# Patient Record
Sex: Female | Born: 1980 | Race: White | Hispanic: Yes | Marital: Single | Smoking: Never smoker
Health system: Southern US, Community
[De-identification: ages and names within clinical notes are randomized; demographics above are authoritative.]

## PROBLEM LIST (undated history)

## (undated) DIAGNOSIS — R87619 Unspecified abnormal cytological findings in specimens from cervix uteri: Secondary | ICD-10-CM

## (undated) DIAGNOSIS — IMO0002 Reserved for concepts with insufficient information to code with codable children: Secondary | ICD-10-CM

## (undated) HISTORY — DX: Unspecified abnormal cytological findings in specimens from cervix uteri: R87.619

## (undated) HISTORY — PX: NO PAST SURGERIES: SHX2092

## (undated) HISTORY — DX: Reserved for concepts with insufficient information to code with codable children: IMO0002

---

## 2004-11-09 ENCOUNTER — Ambulatory Visit (HOSPITAL_COMMUNITY): Admission: RE | Admit: 2004-11-09 | Discharge: 2004-11-09 | Payer: Self-pay | Admitting: *Deleted

## 2005-01-23 ENCOUNTER — Ambulatory Visit: Payer: Self-pay | Admitting: Family Medicine

## 2005-01-23 ENCOUNTER — Inpatient Hospital Stay (HOSPITAL_COMMUNITY): Admission: RE | Admit: 2005-01-23 | Discharge: 2005-01-23 | Payer: Self-pay | Admitting: *Deleted

## 2005-02-07 ENCOUNTER — Ambulatory Visit: Payer: Self-pay | Admitting: *Deleted

## 2005-02-07 ENCOUNTER — Inpatient Hospital Stay (HOSPITAL_COMMUNITY): Admission: AD | Admit: 2005-02-07 | Discharge: 2005-02-10 | Payer: Self-pay | Admitting: *Deleted

## 2009-02-16 ENCOUNTER — Ambulatory Visit (HOSPITAL_COMMUNITY): Admission: RE | Admit: 2009-02-16 | Discharge: 2009-02-16 | Payer: Self-pay | Admitting: Obstetrics & Gynecology

## 2009-06-02 ENCOUNTER — Ambulatory Visit: Payer: Self-pay | Admitting: Obstetrics & Gynecology

## 2009-06-04 ENCOUNTER — Ambulatory Visit: Payer: Self-pay | Admitting: Advanced Practice Midwife

## 2009-06-04 ENCOUNTER — Inpatient Hospital Stay (HOSPITAL_COMMUNITY): Admission: AD | Admit: 2009-06-04 | Discharge: 2009-06-06 | Payer: Self-pay | Admitting: Obstetrics & Gynecology

## 2010-05-24 LAB — CBC
HCT: 35.8 % — ABNORMAL LOW (ref 36.0–46.0)
Hemoglobin: 12.1 g/dL (ref 12.0–15.0)
MCHC: 33.7 g/dL (ref 30.0–36.0)
MCV: 88.7 fL (ref 78.0–100.0)
Platelets: 190 10*3/uL (ref 150–400)
RBC: 4.03 MIL/uL (ref 3.87–5.11)
RDW: 15.8 % — ABNORMAL HIGH (ref 11.5–15.5)
WBC: 15.2 10*3/uL — ABNORMAL HIGH (ref 4.0–10.5)

## 2010-05-24 LAB — RPR: RPR Ser Ql: NONREACTIVE

## 2010-12-05 ENCOUNTER — Inpatient Hospital Stay (HOSPITAL_COMMUNITY)
Admission: AD | Admit: 2010-12-05 | Discharge: 2010-12-05 | Disposition: A | Discharge status: Home / Self Care | Payer: Self-pay | Source: Ambulatory Visit | Attending: Obstetrics & Gynecology | Admitting: Obstetrics & Gynecology

## 2010-12-05 ENCOUNTER — Encounter (HOSPITAL_COMMUNITY): Payer: Self-pay

## 2010-12-05 ENCOUNTER — Inpatient Hospital Stay (HOSPITAL_COMMUNITY): Payer: Self-pay

## 2010-12-05 DIAGNOSIS — N760 Acute vaginitis: Secondary | ICD-10-CM

## 2010-12-05 DIAGNOSIS — O2390 Unspecified genitourinary tract infection in pregnancy, unspecified trimester: Secondary | ICD-10-CM | POA: Insufficient documentation

## 2010-12-05 DIAGNOSIS — B9689 Other specified bacterial agents as the cause of diseases classified elsewhere: Secondary | ICD-10-CM

## 2010-12-05 DIAGNOSIS — O209 Hemorrhage in early pregnancy, unspecified: Secondary | ICD-10-CM

## 2010-12-05 DIAGNOSIS — O219 Vomiting of pregnancy, unspecified: Secondary | ICD-10-CM

## 2010-12-05 DIAGNOSIS — A499 Bacterial infection, unspecified: Secondary | ICD-10-CM | POA: Insufficient documentation

## 2010-12-05 LAB — CBC
HCT: 34.3 % — ABNORMAL LOW (ref 36.0–46.0)
Hemoglobin: 11.6 g/dL — ABNORMAL LOW (ref 12.0–15.0)
MCH: 30.1 pg (ref 26.0–34.0)
MCHC: 33.8 g/dL (ref 30.0–36.0)
MCV: 88.9 fL (ref 78.0–100.0)
Platelets: 356 10*3/uL (ref 150–400)
RBC: 3.86 MIL/uL — ABNORMAL LOW (ref 3.87–5.11)
RDW: 13 % (ref 11.5–15.5)
WBC: 11.5 10*3/uL — ABNORMAL HIGH (ref 4.0–10.5)

## 2010-12-05 LAB — URINALYSIS, ROUTINE W REFLEX MICROSCOPIC
Bilirubin Urine: NEGATIVE
Hgb urine dipstick: NEGATIVE
Ketones, ur: NEGATIVE mg/dL
Leukocytes, UA: NEGATIVE
Nitrite: NEGATIVE
Protein, ur: NEGATIVE mg/dL
Urobilinogen, UA: 0.2 mg/dL (ref 0.0–1.0)

## 2010-12-05 LAB — HCG, QUANTITATIVE, PREGNANCY: hCG, Beta Chain, Quant, S: 92366 m[IU]/mL — ABNORMAL HIGH (ref <5)

## 2010-12-05 LAB — POCT PREGNANCY, URINE: Preg Test, Ur: POSITIVE

## 2010-12-05 LAB — WET PREP, GENITAL: Yeast Wet Prep HPF POC: NONE SEEN

## 2010-12-05 MED ORDER — PRENATAL RX 60-1 MG PO TABS: 1.0000 | ORAL_TABLET | Freq: Every day | ORAL | Status: DC

## 2010-12-05 MED ORDER — ONDANSETRON 8 MG PO TBDP: 8.0000 mg | ORAL_TABLET | Freq: Once | ORAL | Status: AC

## 2010-12-05 MED ORDER — PROMETHAZINE HCL 25 MG PO TABS: 25.0000 mg | ORAL_TABLET | Freq: Four times a day (QID) | ORAL | Status: AC | PRN

## 2010-12-05 MED ORDER — METRONIDAZOLE 500 MG PO TABS: 500.0000 mg | ORAL_TABLET | Freq: Two times a day (BID) | ORAL | Status: AC

## 2010-12-05 MED ORDER — ONDANSETRON 8 MG PO TBDP
8.0000 mg | ORAL_TABLET | Freq: Once | ORAL | Status: AC
  Administered 2010-12-05: 8 mg via ORAL
  Filled 2010-12-05: qty 1

## 2010-12-05 NOTE — ED Provider Notes (Signed)
History     Chief Complaint  Patient presents with  . Vaginal Bleeding   The history is provided by the patient. The history is limited by a language barrier. A language interpreter was used.   Pt presents with positive pregnancy test and spotting on and off with lower abdominal cramping since 10/1.  Pt also states that she has had a vaginal discharge.  Pt has also had some nausea and vomiting. Pt called the health Dept to be seen and since she was having spotting and pain, they advised her to come to [facility].  Past Medical History  Diagnosis Date  . No pertinent past medical history     Past Surgical History  Procedure Date  . No past surgeries     No family history on file.  History  Substance Use Topics  . Smoking status: Never Smoker   . Smokeless tobacco: Not on file  . Alcohol Use: No    Allergies: No Known Allergies  No prescriptions prior to admission    Review of Systems  Gastrointestinal: Positive for nausea, vomiting and abdominal pain. Negative for diarrhea and constipation.  Genitourinary: Negative for dysuria.   Physical Exam   Blood pressure 103/56, pulse 69, temperature 98.8 F (37.1 C), temperature source Oral, resp. rate 16, height 4\' 9"  (1.448 m), weight 108 lb 12.8 oz (49.351 kg), last menstrual period 10/11/2010, SpO2 100.00%.  Physical Exam  Vitals reviewed. Constitutional: She appears well-developed and well-nourished. No distress.  Eyes: Pupils are equal, round, and reactive to light.  Neck: Normal range of motion.  Cardiovascular: Normal rate.   GI: Soft. She exhibits no distension. Mass: wet prep. There is tenderness. There is no rebound and no guarding.       Pt is pointing to left mid quadrant tenderness- no rebound  Genitourinary:       Mod amount of frothy yellow discharge in vault; cervix clean NT uterus 8 week size nontender- adnexal without palpable enlargement or tenderness  Musculoskeletal: Normal range of motion.    Neurological: She is alert.  Skin: Skin is warm and dry.  Psychiatric: She has a normal mood and affect.    MAU Course  Procedures Ultrasound: Clinical Data: Abdominal pain, vaginal bleeding  OBSTETRIC <14 WK [place] AND TRANSVAGINAL OB US  Technique: Both transabdominal and transvaginal ultrasound  examinations were performed for complete evaluation of the  gestation as well as the maternal uterus, adnexal regions, and  pelvic cul-de-sac. Transvaginal technique was performed to assess  early pregnancy.  Comparison: None.  Intrauterine gestational sac: Visualized/normal in shape.  Yolk sac: Visualized  Embryo: Visualized  Cardiac Activity: Visualized  Heart Rate: 158 bpm  CRL: 17.9 mm 8 w 2 d [place] EDC: 07/15/2011  Maternal uterus/adnexae:  The uterus is otherwise normal. No subchorionic  hemorrhage/hematoma. No free fluid within the pelvis.  The right ovary is normal in size measuring 2.6 x 1.6 x 1.7 cm.  The left ovary is normal in size, measuring 2.2 x 1.9 x 1.6 cm. No  discrete adnexal lesions.  IMPRESSION:  1. Single viable intrauterine pregnancy with crown-rump length  correlating with gestational age of [redacted] weeks, 2 days, compatible  with delivery date of 07/15/2011. A detailed anatomy scan is  recommended at 18-21 weeks.  2. Bilateral ovaries appear normal.  Original Report Authenticated By: Waynard Reeds, M.D.      Imaging   Wet prep-mod clue cells GC/Chlamydia- pending CBC HCG ABO-Rh- O Pos Urinalysis-neg Zofran 8  mg ODT given for nausea in MAU    Assessment and Plan  Single living IUP [redacted]w[redacted]d- may proceed with OB care- confirmation of pregnancy Bleeding in early pregnancy Bacterial vaginosis-Flagyl 500 mg BID x 7 days Nausea and vomiting in pregnancy- will give Rx for Zofran and Phenergan  Ameen Mostafa 12/05/2010, 7:28 PM

## 2010-12-05 NOTE — Progress Notes (Signed)
Hospital translator in for triage. Pt states she started having spotting 10-1 and has had some cramping off and on. Had a POS UPT in September at home.

## 2010-12-06 LAB — GC/CHLAMYDIA PROBE AMP, GENITAL
Chlamydia, DNA Probe: NEGATIVE
GC Probe Amp, Genital: NEGATIVE

## 2011-05-14 ENCOUNTER — Other Ambulatory Visit (HOSPITAL_COMMUNITY): Payer: Self-pay | Admitting: Physician Assistant

## 2011-05-14 DIAGNOSIS — Z3689 Encounter for other specified antenatal screening: Secondary | ICD-10-CM

## 2011-05-14 LAB — RPR
RPR: NONREACTIVE
RPR: NONREACTIVE

## 2011-05-14 LAB — VARICELLA ZOSTER ANTIBODY, IGG: Varicella: IMMUNE

## 2011-05-14 LAB — HIV ANTIBODY (ROUTINE TESTING W REFLEX): HIV: NONREACTIVE

## 2011-05-14 LAB — GC/CHLAMYDIA PROBE AMP, GENITAL
Chlamydia: NEGATIVE
Gonorrhea: NEGATIVE

## 2011-05-14 LAB — ABO/RH

## 2011-05-14 LAB — CBC
HCT: 34 % — AB (ref 36–46)
Platelets: 268 10*3/uL (ref 150–399)

## 2011-05-15 ENCOUNTER — Ambulatory Visit (HOSPITAL_COMMUNITY)
Admission: RE | Admit: 2011-05-15 | Discharge: 2011-05-15 | Disposition: A | Discharge status: Home / Self Care | Payer: Medicaid Other | Source: Ambulatory Visit | Attending: Physician Assistant | Admitting: Physician Assistant

## 2011-05-15 DIAGNOSIS — Z363 Encounter for antenatal screening for malformations: Secondary | ICD-10-CM | POA: Insufficient documentation

## 2011-05-15 DIAGNOSIS — Z3689 Encounter for other specified antenatal screening: Secondary | ICD-10-CM

## 2011-05-15 DIAGNOSIS — O358XX0 Maternal care for other (suspected) fetal abnormality and damage, not applicable or unspecified: Secondary | ICD-10-CM | POA: Insufficient documentation

## 2011-05-15 DIAGNOSIS — Z1389 Encounter for screening for other disorder: Secondary | ICD-10-CM | POA: Insufficient documentation

## 2011-05-17 ENCOUNTER — Other Ambulatory Visit (HOSPITAL_COMMUNITY): Payer: Self-pay | Admitting: Physician Assistant

## 2011-05-17 DIAGNOSIS — Z3689 Encounter for other specified antenatal screening: Secondary | ICD-10-CM

## 2011-05-21 ENCOUNTER — Encounter: Payer: Self-pay | Admitting: Family Medicine

## 2011-05-21 ENCOUNTER — Ambulatory Visit (INDEPENDENT_AMBULATORY_CARE_PROVIDER_SITE_OTHER): Payer: Self-pay | Admitting: Obstetrics and Gynecology

## 2011-05-21 VITALS — BP 95/58 | Temp 97.1°F | Wt 125.1 lb

## 2011-05-21 DIAGNOSIS — N390 Urinary tract infection, site not specified: Secondary | ICD-10-CM

## 2011-05-21 DIAGNOSIS — O0993 Supervision of high risk pregnancy, unspecified, third trimester: Secondary | ICD-10-CM | POA: Insufficient documentation

## 2011-05-21 DIAGNOSIS — O409XX0 Polyhydramnios, unspecified trimester, not applicable or unspecified: Secondary | ICD-10-CM

## 2011-05-21 DIAGNOSIS — O4400 Placenta previa specified as without hemorrhage, unspecified trimester: Secondary | ICD-10-CM

## 2011-05-21 DIAGNOSIS — IMO0002 Reserved for concepts with insufficient information to code with codable children: Secondary | ICD-10-CM | POA: Insufficient documentation

## 2011-05-21 DIAGNOSIS — R87613 High grade squamous intraepithelial lesion on cytologic smear of cervix (HGSIL): Secondary | ICD-10-CM | POA: Insufficient documentation

## 2011-05-21 DIAGNOSIS — O403XX0 Polyhydramnios, third trimester, not applicable or unspecified: Secondary | ICD-10-CM

## 2011-05-21 DIAGNOSIS — O2390 Unspecified genitourinary tract infection in pregnancy, unspecified trimester: Secondary | ICD-10-CM

## 2011-05-21 DIAGNOSIS — O2340 Unspecified infection of urinary tract in pregnancy, unspecified trimester: Secondary | ICD-10-CM | POA: Insufficient documentation

## 2011-05-21 DIAGNOSIS — B951 Streptococcus, group B, as the cause of diseases classified elsewhere: Secondary | ICD-10-CM

## 2011-05-21 DIAGNOSIS — O4410 Placenta previa with hemorrhage, unspecified trimester: Secondary | ICD-10-CM

## 2011-05-21 LAB — POCT URINALYSIS DIP (DEVICE)
Glucose, UA: NEGATIVE mg/dL
Hgb urine dipstick: NEGATIVE
Ketones, ur: NEGATIVE mg/dL
Leukocytes, UA: NEGATIVE
Nitrite: NEGATIVE
Protein, ur: NEGATIVE mg/dL
Specific Gravity, Urine: 1.01 (ref 1.005–1.030)
Urobilinogen, UA: 0.2 mg/dL (ref 0.0–1.0)

## 2011-05-21 LAB — FETAL NONSTRESS TEST

## 2011-05-21 NOTE — Progress Notes (Signed)
Patient doing well without complaints. 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

## 2011-05-21 NOTE — Patient Instructions (Addendum)
Placenta previa (Placenta Previa) La placenta previa es un trastorno en el que la placenta crece en la zona inferior de la International aid/development worker (tero) El rgano que conecta al feto con el tero de la madre (la placenta) se implanta en la porcin inferior del cuello del tero (crvix) Puede obstruirlo parcial o completamente. La causa es desconocida. Es ms frecuente en el caso de los nacimientos mltiples o de mellizos. SNTOMAS El signo o sntoma principal es la hemorragia vaginal. La hemorragia puede ser leve o muy abundante y muy grave para la madre y el beb. Generalmente este problema no presenta sntomas. En algunos casos, si la ubicacin de la placenta es muy baja, se desprender parcialmente y Sport and exercise psychologist. Puede tratarse simplemente de la separacin de un seno marginal de la placenta. Es una separacin de los vasos sanguneos de la pared del tero. No suele ocasionar problemas ms all de una leve ansiedad. Hay un riesgo elevado de restriccin del desarrollo intrauterino debido a la ubicacin anormal de la placenta. DIAGNSTICO El diagnstico se lleva a cabo con una ecografa del tero. Es necesario un cuidadoso examen vaginal para observar el cuello del tero. La paciente ser preparada para una intervencin cesrea inmediata, en caso de ser necesario. TRATAMIENTO El tratamiento consiste en reposo en cama, en el hospital o en el hogar. Le administrarn medicamentos para detener las contracciones. Las contracciones pueden aumentar la hemorragia. El mdico extraer lquido del saco amnitico (amniocentesis) para verificar si los pulmones del beb estn lo suficientemente maduros como para soportar la intervencin cesrea. Si hay poca cantidad de glbulos rojos, ser necesaria una transfusin de [place]. Si hay una placenta previa en grado leve, no es necesario ningn tratamiento. La placenta previa en las primeras etapas suele resolverse sin ayuda. La placenta se mueve hacia un lugar ms alto en el  canal de parto, a medida que el [employer]. En este caso, la placenta ya no constituye una obstruccin para el parto. Puede ser necesario controlar la posicin de la placenta durante el [place]. Esto se lleva a cabo con una ecografa en el vientre (abdomen). Consulte inmediatamente con el profesional si la hemorragia es muy intensa. Podr ser necesario reponer lquidos o sangre inmediatamente. En caso de placenta previa completa, el nico modo de parto seguro es la operacin cesrea. INSTRUCCIONES PARA EL CUIDADO DOMICILIARIO  Siga las indicaciones del profesional con respecto al reposo en cama.   Tome las pldoras de hierro y todos los medicamentos que le prescriban.   No se incline ni levante objetos pesados.   No mantenga relaciones sexuales.   No introduzca nada en la vagina (tampones o cremas vaginales). Si tiene hemorragia, use toallas higinicas.   Si el profesional que lo [employer] pide que concurra a una cita de seguimiento, es importante asistir a ella. No concurrir a los IT consultant como consecuencia un dao crnico o [place], [place], discapacidad y [place] la muerte suya o del feto. Si tiene algn problema para asistir a la cita, debe comunicarse con el establecimiento para obtener asistencia.  SOLICITE ATENCIN MDICA DE INMEDIATO SI :  Observa un aumento de la hemorragia.   Si se siente mareada o se desmaya.   Presenta dolor abdominal.   Ya no puede sentir los movimientos normales del beb.   Siente contracciones uterinas.  Document Released: 11/29/2004 Document Revised: 02/08/2011 [facility] Patient Information 2012 [place], [place].  Papanicolau (Pap Test) El papanicolau consiste en la toma de una muestra de clulas del cuello del tero  de [employer]. El cuello uterino es la abertura que comunica el tero con la vagina (canal de [place]). Las clulas se obtienen durante el examen plvico. Las clulas se observan al microscopio para ver si son normales o se  estn desarrollando clulas cancerosas, o existen modificaciones que indican que se Art gallery manager. La displasia cervical es un trastorno en el que una mujer tiene modificaciones anormales en las clulas del cuello uterino. Estos cambios son un signo precoz de que puede Art gallery manager cervical. Tambin puede detectarse el virus del Geneticist, molecular (VPH) debido a que 4 tipos son responsables en el 70% de los casos de cncer cervical. Tambin puede detectar infecciones producidas por bacterias, hongos, protozoos y virus.  El cncer cervical es difcil de tratar y es menos probable que tenga un buen desenlace si no se trata. Detectar la enfermedad en sus estadios ms tempranos conduce a mejores resultados. Desde que se ha comenzado a Forensic scientist, hace 60 aos, las muertes por cncer cervical disminuyeron en un 70%. Todas las mujeres deben hacer un examen Papanicolau. LOS FACTORES DE RIESGO PARA EL CNCER CERVICAL SON:   Ser sexualmente activa antes de los 18 aos.   Ser hija de una mujer que tom dietilstilbestrol (DES) durante el embarazo.   Tener un compaero sexual que ha sufrido cncer de pene.   Tener un compaero sexual cuya pareja haya sufrido cncer cervical o displasia cervical (las primeras modificaciones que indican que se puede Civil Service fast streamer.   Debilidad en el sistema inmunolgico. Por ejemplo sufrir VIH u otros problemas de inmunodeficiencia.   Haber sufrido infecciones de transmisin sexual, como clamidia, gonorrea o VPH.   Tener un Papanicolau anormal o cncer en la vagina o la vulva.   Tener ms de un compaero sexual.   Scheryl Darter de cncer cervical en la hermana o la madre.   No usar condones con un nuevo compaero sexual.   El consumo de cigarrillos.  Fleet Contras UN PAPANICOLAU  Un Papanicolau se realiza para Engineer, site cervical.   El primer Papanicolaou debe realizarse los 21 aos.   Dynegy 21 y los 29 aos debe  repetirse [address]   Luego de los 30 aos, debe realizarse un Papanicolaou cada tres aos siempre que los 3 estudios anteriores sean normales.   Algunas mujeres sufren problemas mdicos que aumentan la probabilidad de Research officer, political party cervical. Consulte a su mdico acerca de estos problemas. Es muy importante que le informe a su mdico si aparecen nuevos problemas poco despus de su ltimo Papanicolaou. En estos casos, el mdico podr indicar que se realice el Papanicolaou con ms frecuencia.   Estas recomendaciones son las mismas para todas las mujeres hayan recibido o no la vacuna para el VPH (virus del papiloma humano).   Si le han realizado una histerectoma por un problema que no era cncer u otra enfermedad que podra causar cncer, ya no necesitar un Papanicolaou. Sin embargo, aunque ya no necesite un Papanicolau, es una buena idea Medical sales representative examen regular para asegurarse de que no hay otros problemas.    Si tiene entre 65 y 56 aos y ha tenido un Papanicolaou normal en los ltimos 10 aos, ya no ser Music therapist. Sin embargo, aunque ya no necesite un Papanicolau, es una buena idea Medical sales representative examen regular para asegurarse de que no hay otros problemas.    Si ha recibido un tratamiento para Management consultant cervical o para una enfermedad que podra causar cncer,  necesitar realizar un Papanicolaou y controles durante al menos 20 aos de concluir el tratamiento   Si no ha tenido continuidad para [employer] Papanicolau, debern evaluarse nuevamente los factores de riesgo (como tener una nueva pareja sexual) para Chief Strategy Officer si deben [employer].   Algunas mujeres necesitarn realizarse estudios con ms frecuencia si tienen factores de riesgo para Engineer, structural.  PREPARACIN PARA EL PAPANICOLAU Debe realizarse durante las semanas anteriores a la [place]. No deben hacerse duchas vaginales ni tener [employer] 24 horas anteriores a la prueba. No  colocarse cremas vaginales, diafragmas o tampones durante las 24 horas anteriores a la prueba. Para minimizar las molestias, podr vaciar la vejiga antes del examen. TOMA DE LA MUESTRA El profesional le har un examen plvico. Colocar un instrumento de metal o plstico (espculo) en la vagina. Esto se hace antes de que el mdico realice un examen bimanual de los rganos femeninos internos. Este instrumento le permite al mdico observar el interior de la vagina y Public affairs consultant el cuello uterino. Para tomar la [place] de las clulas de la abertura interna del cuello uterino, se utiliza un pequeo cepillo estril. Para raspar la zona externa del cuello uterino se utiliza una esptula de [place]. [place] de [employer]. Las [employer] se colocan en un portaobjetos de vidrio o en una pequea botella llena con un lquido especial. Luego las clulas se observarn en el microscopio de un laboratorio. Un especialista observar las clulas y determinar si son normales. RESULTADOS DEL PAPANICOLAU  Un Papanicolau normal no muestra clulas anormales ni evidencias de inflamacin.   La presencia de clulas que se desarrollan de manera anormal en la superficie del cuello uterino ser informada como un Papanicolau anormal. Se utilizan diferentes categoras de Librarian, academic. El profesional que la asiste comentar con usted la importancia de Proofreader. El Nurse, children's cuales son los controles que deber seguir o cuando deber Producer, television/film/video.   Si tiene dos  ms Pap anormales:   Le solicitarn que se haga una colposcopa. En esta prueba se observar el cervix con un microscpico luminoso especial.   Tambin ser necesaria la toma de [place] de tejido (biopsia). Se realiza tomando una pequea [place] de tejido. La muestra es observada en el microscopio para hallar la causa de las clulas anormales. Asegrese de [employer] de su Papanicolau. Si no recibe los  [address] de [place], comunquese con el consultorio de su mdico. Recuerde que es su responsabilidad obtener los [place] y Financial risk analyst cuales son los controles que debe seguir.  Document Released: 08/08/2007 Document Revised: 02/08/2011 [facility] Patient Information 2012 [place], [place].

## 2011-05-21 NOTE — Progress Notes (Signed)
Pelvic pressure. No vaginal discharge. +GBS in urine. 1 Hr GTT 53. Pap HSIL. Complete placenta previa and polyhydramnios. Pt to see nutrition and Child psychotherapist. Interpreter was present.

## 2011-05-24 ENCOUNTER — Encounter: Payer: Self-pay | Admitting: Obstetrics and Gynecology

## 2011-05-25 ENCOUNTER — Other Ambulatory Visit: Payer: Self-pay

## 2011-05-28 ENCOUNTER — Ambulatory Visit (INDEPENDENT_AMBULATORY_CARE_PROVIDER_SITE_OTHER): Payer: Self-pay | Admitting: Obstetrics and Gynecology

## 2011-05-28 VITALS — BP 102/50 | Temp 97.1°F | Ht <= 58 in | Wt 125.2 lb

## 2011-05-28 DIAGNOSIS — B951 Streptococcus, group B, as the cause of diseases classified elsewhere: Secondary | ICD-10-CM

## 2011-05-28 DIAGNOSIS — IMO0002 Reserved for concepts with insufficient information to code with codable children: Secondary | ICD-10-CM

## 2011-05-28 DIAGNOSIS — N390 Urinary tract infection, site not specified: Secondary | ICD-10-CM

## 2011-05-28 DIAGNOSIS — O0990 Supervision of high risk pregnancy, unspecified, unspecified trimester: Secondary | ICD-10-CM

## 2011-05-28 DIAGNOSIS — O409XX0 Polyhydramnios, unspecified trimester, not applicable or unspecified: Secondary | ICD-10-CM

## 2011-05-28 DIAGNOSIS — O2390 Unspecified genitourinary tract infection in pregnancy, unspecified trimester: Secondary | ICD-10-CM

## 2011-05-28 DIAGNOSIS — O4400 Placenta previa specified as without hemorrhage, unspecified trimester: Secondary | ICD-10-CM

## 2011-05-28 DIAGNOSIS — O403XX0 Polyhydramnios, third trimester, not applicable or unspecified: Secondary | ICD-10-CM

## 2011-05-28 DIAGNOSIS — R87613 High grade squamous intraepithelial lesion on cytologic smear of cervix (HGSIL): Secondary | ICD-10-CM

## 2011-05-28 DIAGNOSIS — O0993 Supervision of high risk pregnancy, unspecified, third trimester: Secondary | ICD-10-CM

## 2011-05-28 LAB — POCT URINALYSIS DIP (DEVICE)
Bilirubin Urine: NEGATIVE
Hgb urine dipstick: NEGATIVE
Ketones, ur: NEGATIVE mg/dL
Nitrite: NEGATIVE
pH: 6.5 (ref 5.0–8.0)

## 2011-05-28 LAB — FETAL NONSTRESS TEST

## 2011-05-28 NOTE — Progress Notes (Signed)
3/25 NST reviewed and category I. Patient doing well without complaints. Denies any vaginal bleeding. Patient presenting today for colposcopy. Patient with h/o LGSIL in 10/2010 but never had colposcopy. Repeat pap smear on 05/14/2011 HGSIL Patient given informed consent, signed copy in the chart, time out was performed.  Placed in lithotomy position. Cervix viewed with speculum and colposcope after application of acetic acid.   Colposcopy adequate?  yes Acetowhite lesions?9 o'clock position Punctation?no  Mosaicism?  no Abnormal vasculature?  no Biopsies?no ECC?no  COMMENTS: Patient was given post procedure instructions.  She will return as planned for ob appointments. Patient understands that further management for her abnormal pap smear will be addressed 6 weeks postpartum.

## 2011-05-28 NOTE — Progress Notes (Incomplete)
Nutrition Note:  (1st consult) Pt dx with placenta previa and polyhydraminos.   Current wt gain of 16.2# @ [redacted]w[redacted]d plots around 2#< expected (pg BMI- normal weight).   Pt also has low Fe levels (9.4ug/dL /78%) at 2/95/62 visit. Pt reports good intake of 3 meals and snacks, typically fruits.  No food allergies and no N/V reported.   Does take PNV daily, no Fe supplement reported. Pt receives [facility] services and plans to breastfeed. Pt agrees to increase iron rich foods and add 1 protein rich snack into daily intake.  Follow up if referred.  Cy Blamer, RD

## 2011-05-31 ENCOUNTER — Other Ambulatory Visit: Payer: Self-pay

## 2011-06-04 ENCOUNTER — Ambulatory Visit (INDEPENDENT_AMBULATORY_CARE_PROVIDER_SITE_OTHER): Payer: Self-pay | Admitting: Obstetrics & Gynecology

## 2011-06-04 VITALS — BP 97/53 | Temp 97.7°F | Wt 125.8 lb

## 2011-06-04 DIAGNOSIS — O4400 Placenta previa specified as without hemorrhage, unspecified trimester: Secondary | ICD-10-CM

## 2011-06-04 DIAGNOSIS — O0993 Supervision of high risk pregnancy, unspecified, third trimester: Secondary | ICD-10-CM

## 2011-06-04 DIAGNOSIS — O409XX0 Polyhydramnios, unspecified trimester, not applicable or unspecified: Secondary | ICD-10-CM

## 2011-06-04 DIAGNOSIS — O403XX0 Polyhydramnios, third trimester, not applicable or unspecified: Secondary | ICD-10-CM

## 2011-06-04 LAB — POCT URINALYSIS DIP (DEVICE)
Hgb urine dipstick: NEGATIVE
Ketones, ur: NEGATIVE mg/dL
Protein, ur: NEGATIVE mg/dL
Specific Gravity, Urine: 1.015 (ref 1.005–1.030)
pH: 5 (ref 5.0–8.0)

## 2011-06-04 LAB — FETAL NONSTRESS TEST

## 2011-06-04 NOTE — Progress Notes (Signed)
No complaints, [place] 4/2 to check AFI and placentation. NST today reactive

## 2011-06-04 NOTE — Patient Instructions (Signed)
Placenta previa (Placenta Previa) La placenta previa es un trastorno en el que la placenta crece en la zona inferior de la matriz (tero) El rgano que conecta al feto con el tero de la madre (la placenta) se implanta en la porcin inferior del cuello del tero (crvix) Puede obstruirlo parcial o completamente. La causa es desconocida. Es ms frecuente en el caso de los nacimientos mltiples o de mellizos. SNTOMAS El signo o sntoma principal es la hemorragia vaginal. La hemorragia puede ser leve o muy abundante y muy grave para la madre y el beb. Generalmente este problema no presenta sntomas. En algunos casos, si la ubicacin de la placenta es muy baja, se desprender parcialmente y ocasionar una hemorragia. Puede tratarse simplemente de la separacin de un seno marginal de la placenta. Es una separacin de los vasos sanguneos de la pared del tero. No suele ocasionar problemas ms all de una leve ansiedad. Hay un riesgo elevado de restriccin del desarrollo intrauterino debido a la ubicacin anormal de la placenta. DIAGNSTICO El diagnstico se lleva a cabo con una ecografa del tero. Es necesario un cuidadoso examen vaginal para observar el cuello del tero. La paciente ser preparada para una intervencin cesrea inmediata, en caso de ser necesario. TRATAMIENTO El tratamiento consiste en reposo en cama, en el hospital o en el hogar. Le administrarn medicamentos para detener las contracciones. Las contracciones pueden aumentar la hemorragia. El mdico extraer lquido del saco amnitico (amniocentesis) para verificar si los pulmones del beb estn lo suficientemente maduros como para soportar la intervencin cesrea. Si hay poca cantidad de glbulos rojos, ser necesaria una transfusin de sangre. Si hay una placenta previa en grado leve, no es necesario ningn tratamiento. La placenta previa en las primeras etapas suele resolverse sin ayuda. La placenta se mueve hacia un lugar ms alto en el  canal de parto, a medida que el embarazo avanza. En este caso, la placenta ya no constituye una obstruccin para el parto. Puede ser necesario controlar la posicin de la placenta durante el embarazo. Esto se lleva a cabo con una ecografa en el vientre (abdomen). Consulte inmediatamente con el profesional si la hemorragia es muy intensa. Podr ser necesario reponer lquidos o sangre inmediatamente. En caso de placenta previa completa, el nico modo de parto seguro es la operacin cesrea. INSTRUCCIONES PARA EL CUIDADO DOMICILIARIO  Siga las indicaciones del profesional con respecto al reposo en cama.   Tome las pldoras de hierro y todos los medicamentos que le prescriban.   No se incline ni levante objetos pesados.   No mantenga relaciones sexuales.   No introduzca nada en la vagina (tampones o cremas vaginales). Si tiene hemorragia, use toallas higinicas.   Si el profesional que lo asiste le pide que concurra a una cita de seguimiento, es importante asistir a ella. No concurrir a los controles puede tener como consecuencia un dao crnico o permanente, dolores, discapacidad y hasta la muerte suya o del feto. Si tiene algn problema para asistir a la cita, debe comunicarse con el establecimiento para obtener asistencia.  SOLICITE ATENCIN MDICA DE INMEDIATO SI :  Observa un aumento de la hemorragia.   Si se siente mareada o se desmaya.   Presenta dolor abdominal.   Ya no puede sentir los movimientos normales del beb.   Siente contracciones uterinas.  Document Released: 11/29/2004 Document Revised: 02/08/2011 [employer] Patient Information 2012 [employer]. 

## 2011-06-04 NOTE — Progress Notes (Signed)
P = 67   Pt has [place] @ [facility]FM  tomorrow

## 2011-06-05 ENCOUNTER — Ambulatory Visit (HOSPITAL_COMMUNITY): Payer: Self-pay

## 2011-06-08 ENCOUNTER — Other Ambulatory Visit: Payer: Self-pay

## 2011-06-11 ENCOUNTER — Ambulatory Visit (HOSPITAL_COMMUNITY)
Admission: RE | Admit: 2011-06-11 | Discharge: 2011-06-11 | Disposition: A | Discharge status: Home / Self Care | Payer: Medicaid Other | Source: Ambulatory Visit | Attending: Physician Assistant | Admitting: Physician Assistant

## 2011-06-11 ENCOUNTER — Other Ambulatory Visit: Payer: Self-pay

## 2011-06-11 ENCOUNTER — Ambulatory Visit (INDEPENDENT_AMBULATORY_CARE_PROVIDER_SITE_OTHER): Payer: Self-pay | Admitting: Obstetrics & Gynecology

## 2011-06-11 VITALS — BP 98/61 | HR 61 | Temp 97.1°F | Wt 126.3 lb

## 2011-06-11 DIAGNOSIS — O4410 Placenta previa with hemorrhage, unspecified trimester: Secondary | ICD-10-CM | POA: Insufficient documentation

## 2011-06-11 DIAGNOSIS — Z3689 Encounter for other specified antenatal screening: Secondary | ICD-10-CM | POA: Insufficient documentation

## 2011-06-11 DIAGNOSIS — O4400 Placenta previa specified as without hemorrhage, unspecified trimester: Secondary | ICD-10-CM

## 2011-06-11 DIAGNOSIS — O403XX0 Polyhydramnios, third trimester, not applicable or unspecified: Secondary | ICD-10-CM

## 2011-06-11 DIAGNOSIS — O409XX0 Polyhydramnios, unspecified trimester, not applicable or unspecified: Secondary | ICD-10-CM

## 2011-06-11 LAB — POCT URINALYSIS DIP (DEVICE)
Bilirubin Urine: NEGATIVE
Hgb urine dipstick: NEGATIVE
Ketones, ur: NEGATIVE mg/dL
pH: 7 (ref 5.0–8.0)

## 2011-06-11 LAB — US OB COMP + 14 WK

## 2011-06-11 NOTE — Progress Notes (Signed)
Growth [place] scheduled today @ 1015

## 2011-06-11 NOTE — Progress Notes (Signed)
NST today reactive. [place] was rescheduled for today

## 2011-06-11 NOTE — Patient Instructions (Signed)
Placenta previa (Placenta Previa) La placenta previa es un trastorno en el que la placenta crece en la zona inferior de la matriz (tero) El rgano que conecta al feto con el tero de la madre (la placenta) se implanta en la porcin inferior del cuello del tero (crvix) Puede obstruirlo parcial o completamente. La causa es desconocida. Es ms frecuente en el caso de los nacimientos mltiples o de mellizos. SNTOMAS El signo o sntoma principal es la hemorragia vaginal. La hemorragia puede ser leve o muy abundante y muy grave para la madre y el beb. Generalmente este problema no presenta sntomas. En algunos casos, si la ubicacin de la placenta es muy baja, se desprender parcialmente y ocasionar una hemorragia. Puede tratarse simplemente de la separacin de un seno marginal de la placenta. Es una separacin de los vasos sanguneos de la pared del tero. No suele ocasionar problemas ms all de una leve ansiedad. Hay un riesgo elevado de restriccin del desarrollo intrauterino debido a la ubicacin anormal de la placenta. DIAGNSTICO El diagnstico se lleva a cabo con una ecografa del tero. Es necesario un cuidadoso examen vaginal para observar el cuello del tero. La paciente ser preparada para una intervencin cesrea inmediata, en caso de ser necesario. TRATAMIENTO El tratamiento consiste en reposo en cama, en el hospital o en el hogar. Le administrarn medicamentos para detener las contracciones. Las contracciones pueden aumentar la hemorragia. El mdico extraer lquido del saco amnitico (amniocentesis) para verificar si los pulmones del beb estn lo suficientemente maduros como para soportar la intervencin cesrea. Si hay poca cantidad de glbulos rojos, ser necesaria una transfusin de sangre. Si hay una placenta previa en grado leve, no es necesario ningn tratamiento. La placenta previa en las primeras etapas suele resolverse sin ayuda. La placenta se mueve hacia un lugar ms alto en el  canal de parto, a medida que el embarazo avanza. En este caso, la placenta ya no constituye una obstruccin para el parto. Puede ser necesario controlar la posicin de la placenta durante el embarazo. Esto se lleva a cabo con una ecografa en el vientre (abdomen). Consulte inmediatamente con el profesional si la hemorragia es muy intensa. Podr ser necesario reponer lquidos o sangre inmediatamente. En caso de placenta previa completa, el nico modo de parto seguro es la operacin cesrea. INSTRUCCIONES PARA EL CUIDADO DOMICILIARIO  Siga las indicaciones del profesional con respecto al reposo en cama.   Tome las pldoras de hierro y todos los medicamentos que le prescriban.   No se incline ni levante objetos pesados.   No mantenga relaciones sexuales.   No introduzca nada en la vagina (tampones o cremas vaginales). Si tiene hemorragia, use toallas higinicas.   Si el profesional que lo asiste le pide que concurra a una cita de seguimiento, es importante asistir a ella. No concurrir a los controles puede tener como consecuencia un dao crnico o permanente, dolores, discapacidad y hasta la muerte suya o del feto. Si tiene algn problema para asistir a la cita, debe comunicarse con el establecimiento para obtener asistencia.  SOLICITE ATENCIN MDICA DE INMEDIATO SI :  Observa un aumento de la hemorragia.   Si se siente mareada o se desmaya.   Presenta dolor abdominal.   Ya no puede sentir los movimientos normales del beb.   Siente contracciones uterinas.  Document Released: 11/29/2004 Document Revised: 02/08/2011 [employer] Patient Information 2012 [employer]. 

## 2011-06-11 NOTE — Progress Notes (Signed)
Pulse 61. No pain. Pressure in pelvic. Vaginal d/c described as small watery, and cloudy that happened twice yesterday.

## 2011-06-12 ENCOUNTER — Other Ambulatory Visit (HOSPITAL_COMMUNITY): Payer: Self-pay | Admitting: Physician Assistant

## 2011-06-12 DIAGNOSIS — Z3689 Encounter for other specified antenatal screening: Secondary | ICD-10-CM

## 2011-06-15 ENCOUNTER — Encounter: Payer: Self-pay | Admitting: *Deleted

## 2011-06-18 ENCOUNTER — Ambulatory Visit (INDEPENDENT_AMBULATORY_CARE_PROVIDER_SITE_OTHER): Payer: Self-pay | Admitting: Obstetrics & Gynecology

## 2011-06-18 VITALS — BP 108/60 | Temp 97.7°F | Wt 127.0 lb

## 2011-06-18 DIAGNOSIS — O0993 Supervision of high risk pregnancy, unspecified, third trimester: Secondary | ICD-10-CM

## 2011-06-18 DIAGNOSIS — O4400 Placenta previa specified as without hemorrhage, unspecified trimester: Secondary | ICD-10-CM

## 2011-06-18 LAB — POCT URINALYSIS DIP (DEVICE)
Bilirubin Urine: NEGATIVE
Ketones, ur: NEGATIVE mg/dL
Protein, ur: NEGATIVE mg/dL
Specific Gravity, Urine: 1.015 (ref 1.005–1.030)

## 2011-06-18 NOTE — Progress Notes (Signed)
Addended by: Adam Phenix on: 06/18/2011 10:12 AM   Modules accepted: Orders

## 2011-06-18 NOTE — Progress Notes (Signed)
Pulse 62. No pain. Pressure on pelvic. Vaginal d/c described as mucous yellow; no odor, no itch.

## 2011-06-18 NOTE — Patient Instructions (Signed)
Parto por cesrea  (Cesarean Delivery) El parto por cesrea es el nacimiento de un feto a travs de un corte (incisin) en el vientre (abdomen) y en el matriz (tero).  HGALE SABER A SU MDICO SOBRE:   Complicaciones del embarazo.   Alergias.   Medicamentos que utiliza, incluyendo hierbas, gotas oftlmicas, medicamentos de venta libre y cremas.   Uso de corticoides (por va oral o cremas).   Problemas anteriores debido a anestsicos o a medicamentos que disminuyen la sensibilidad.   Cirugas anteriores.   Historia de cogulos sanguneos   Problemas hemorrgicos o en la sangre.   Otros problemas de salud.  RIESGOS Y COMPLICACIONES  Hemorragias.   Infeccin.   Cogulos sanguneos.   Lesin en los rganos circundantes.   Problemas con la anestesia.   Lesiones al beb.  ANTES DEL PROCEDIMIENTO   Le insertarn un tubo (catter Foley)en la vejiga. El catter Foley drena la orina desde la vejiga hacia una bolsa. Esto mantendr la vejiga vaca durante la ciruga.   Le colocarn un catter de acceso intravenoso (IV) en el brazo.   Luego rasurarn la zona del pubis y la parte inferior del abdomen. Esto se realiza para evitar una infeccin en el sitio de la incisin.   Le administrarn un medicamento anticido. Esto impedir que los contenidos cidos del estmago ingresen a los pulmones si vomita durante el procedimiento.   Le podrn dar antibiticos para prevenir infecciones.  PROCEDIMIENTO   Le administrarn un medicamento para adormecer a zona inferior del cuerpo anestesia regional). Si estuviera en trabajo de parto, podrn aplicarle una anestesia epidural, que se utiliza tanto el el trabajo de parto como en la cesrea. Puede ser que le administren un medicamento que la har dormir (anestesia general), aunque esto no es tan frecuente.   Le harn una incisin en el abdomen que se extiende hacia el tero. Hay dos tipos bsicos de incisin:   La incisin horizontal  (transversa) Las incisiones horizontales se realizan en la mayor parte de las cesreas de rutina.   La incisin vertical (de arriba hacia abajo). Esta es la menos frecuente. Se reserva para aquellas mujeres que tienen una complicacin grave (parto prematuro extremo) o bajo situaciones de emergencia.   Las incisiones horizontales y verticales pueden utilizarse ambas al mismo tiempo. Sin embargo, no es muy frecuente.   El beb nacer.  DESPUS DEL PROCEDIMIENTO   Si estuvo despierta durante la ciruga, podr ver al beb enseguida. Si la duermen, ver al beb tan pronto como despierte.   Podr amamantar a su beb despus del procedimiento.   Podr levantarse y caminar el mismo da de la ciruga. Si debe permanecer en cama durante cierto tiempo, recibir ayuda para darse vuelta, toser y respirar profundamente despus de la ciruga. Esto ayuda a evitar complicaciones en los pulmones, como la neumona.   No se levante de la cama sola la primera vez luego de la ciruga. Necesitar ayuda para levantarse de la cama hasta que pueda hacerlo sola.   Podr darse una ducha el da siguiente a la ciruga. Despus que le quiten el vendaje, un enfermero la ayudar a ducharse.   Tendr unas medias compresivas neumticas en los pies o en la zona inferior de las piernas. Se colocan para prevenir cogulos sanguneos. Cuando se levante y camine regularmente, ya no sern necesarias.   Nocruce las piernas al sentarse.   Si elimina cogulos de sangre, gurdelos. Si elimina un cogulo cuando va al bao, por favor no tire la   cadena. Llame al enfermero. Comunquele al enfermero si piensa que tiene demasiada hemorragia o que elimina muchos cogulos.   Comience a beber lquidos y a alimentarse segn las indicaciones del mdico. Si el estmago no est en condiciones,comer y beber demasiado pronto puede causar aumento de la hinchazn y la inflamacin del intestino o el abdomen. Esto es muy molesto.   Le darn  medicamentos si los necesita. Dgale a los profesionales si siente dolor. Ellos tratarn de que se sienta cmoda. Tambin le indicarn antibiticos para prevenir una infeccin.   Le quitarn la va intravenosa cuando beba una cantidad razonable de lquido. El catter Foley se retirar cuando se levante y camine.   Si su tipo sanguneo es Rh negativo y el beb es Rh positivo, le darn una inyeccin de inmunoblobulina anti D. Esta inyeccin evita que tenga problemas con el Rh en embarazos futuros. Deber colocarse la inyeccin an si se ha hecho ligar las trompas.   Si le permiten llevar al beb a dar un paseo,colquelo en la cunita y empjela. No lleve al beb en sus brazos.  Document Released: 02/19/2005 Document Revised: 02/08/2011 [employer] Patient Information 2012 [employer]. 

## 2011-06-18 NOTE — Progress Notes (Signed)
C/S scheduled June 22, 2011 at 9 am. Patient advised via interpreter of date and time and to be NPO after midnight. Patient advised [facility] will call her with pre-op appointment time and date.

## 2011-06-18 NOTE — Progress Notes (Signed)
Addended by: Adam Phenix on: 06/18/2011 10:22 AM   Modules accepted: Orders

## 2011-06-18 NOTE — Progress Notes (Signed)
Schedule  At 36 w 5 days on 06/22/11

## 2011-06-18 NOTE — Progress Notes (Signed)
[place] 4/9 showed complete previa, Explained to patient the need to schedule CS. GBS done. Send GC and CT from urine. Normal AFI

## 2011-06-19 LAB — GC/CHLAMYDIA PROBE AMP, URINE
Chlamydia, Swab/Urine, PCR: NEGATIVE
GC Probe Amp, Urine: NEGATIVE

## 2011-06-20 ENCOUNTER — Inpatient Hospital Stay (HOSPITAL_COMMUNITY): Payer: Medicaid Other | Admitting: Anesthesiology

## 2011-06-20 ENCOUNTER — Encounter (HOSPITAL_COMMUNITY): Payer: Self-pay | Admitting: Anesthesiology

## 2011-06-20 ENCOUNTER — Encounter (HOSPITAL_COMMUNITY): Payer: Self-pay | Admitting: *Deleted

## 2011-06-20 ENCOUNTER — Encounter (HOSPITAL_COMMUNITY): Payer: Self-pay

## 2011-06-20 ENCOUNTER — Encounter (HOSPITAL_COMMUNITY)
Admission: RE | Admit: 2011-06-20 | Discharge: 2011-06-20 | Disposition: A | Discharge status: Home / Self Care | Payer: Medicaid Other | Source: Ambulatory Visit | Attending: Obstetrics & Gynecology | Admitting: Obstetrics & Gynecology

## 2011-06-20 ENCOUNTER — Encounter (HOSPITAL_COMMUNITY)
Admission: AD | Disposition: A | Discharge status: Home / Self Care | Payer: Self-pay | Source: Ambulatory Visit | Attending: Family Medicine

## 2011-06-20 ENCOUNTER — Inpatient Hospital Stay (HOSPITAL_COMMUNITY)
Admission: AD | Admit: 2011-06-20 | Discharge: 2011-06-23 | DRG: 765 | Disposition: A | Discharge status: Home / Self Care | Payer: Medicaid Other | Source: Ambulatory Visit | Attending: Family Medicine | Admitting: Family Medicine

## 2011-06-20 DIAGNOSIS — O9903 Anemia complicating the puerperium: Secondary | ICD-10-CM | POA: Diagnosis not present

## 2011-06-20 DIAGNOSIS — D649 Anemia, unspecified: Secondary | ICD-10-CM | POA: Diagnosis not present

## 2011-06-20 DIAGNOSIS — O4410 Placenta previa with hemorrhage, unspecified trimester: Secondary | ICD-10-CM

## 2011-06-20 DIAGNOSIS — O4413 Placenta previa with hemorrhage, third trimester: Secondary | ICD-10-CM

## 2011-06-20 DIAGNOSIS — O409XX0 Polyhydramnios, unspecified trimester, not applicable or unspecified: Secondary | ICD-10-CM | POA: Diagnosis present

## 2011-06-20 LAB — CBC
Hemoglobin: 8.5 g/dL — ABNORMAL LOW (ref 12.0–15.0)
MCH: 22.5 pg — ABNORMAL LOW (ref 26.0–34.0)
MCV: 74.9 fL — ABNORMAL LOW (ref 78.0–100.0)
RBC: 3.78 MIL/uL — ABNORMAL LOW (ref 3.87–5.11)
WBC: 5.1 10*3/uL (ref 4.0–10.5)

## 2011-06-20 LAB — PREPARE RBC (CROSSMATCH)

## 2011-06-20 SURGERY — Surgical Case
Anesthesia: Spinal | Site: Abdomen | Wound class: Clean Contaminated

## 2011-06-20 MED ORDER — NALBUPHINE SYRINGE 5 MG/0.5 ML
5.0000 mg | INJECTION | INTRAMUSCULAR | Status: DC | PRN
  Filled 2011-06-20: qty 1

## 2011-06-20 MED ORDER — SCOPOLAMINE 1 MG/3DAYS TD PT72: 1.0000 | MEDICATED_PATCH | Freq: Once | TRANSDERMAL | Status: DC

## 2011-06-20 MED ORDER — KETOROLAC TROMETHAMINE 30 MG/ML IJ SOLN: 30.0000 mg | Freq: Four times a day (QID) | INTRAMUSCULAR | Status: AC | PRN

## 2011-06-20 MED ORDER — SODIUM CHLORIDE 0.9 % IV SOLN
1.0000 ug/kg/h | INTRAVENOUS | Status: DC | PRN
  Filled 2011-06-20: qty 2.5

## 2011-06-20 MED ORDER — IBUPROFEN 600 MG PO TABS: 600.0000 mg | ORAL_TABLET | Freq: Four times a day (QID) | ORAL | Status: DC

## 2011-06-20 MED ORDER — CEFAZOLIN SODIUM 1-5 GM-% IV SOLN
1.0000 g | Freq: Once | INTRAVENOUS | Status: DC
  Filled 2011-06-20: qty 50

## 2011-06-20 MED ORDER — KETOROLAC TROMETHAMINE 30 MG/ML IJ SOLN: 30.0000 mg | Freq: Four times a day (QID) | INTRAMUSCULAR | Status: DC | PRN

## 2011-06-20 MED ORDER — ONDANSETRON HCL 4 MG/2ML IJ SOLN: 4.0000 mg | Freq: Three times a day (TID) | INTRAMUSCULAR | Status: DC | PRN

## 2011-06-20 MED ORDER — IBUPROFEN 600 MG PO TABS: 600.0000 mg | ORAL_TABLET | Freq: Four times a day (QID) | ORAL | Status: DC | PRN

## 2011-06-20 MED ORDER — HYDROMORPHONE HCL PF 1 MG/ML IJ SOLN
INTRAMUSCULAR | Status: AC
  Filled 2011-06-20: qty 1

## 2011-06-20 MED ORDER — FENTANYL CITRATE 0.05 MG/ML IJ SOLN
INTRAMUSCULAR | Status: AC
  Filled 2011-06-20: qty 2

## 2011-06-20 MED ORDER — METOCLOPRAMIDE HCL 5 MG/ML IJ SOLN: 10.0000 mg | Freq: Three times a day (TID) | INTRAMUSCULAR | Status: DC | PRN

## 2011-06-20 MED ORDER — SODIUM CHLORIDE 0.9 % IV SOLN: 1.0000 ug/kg/h | INTRAVENOUS | Status: DC | PRN

## 2011-06-20 MED ORDER — MEPERIDINE HCL 25 MG/ML IJ SOLN: 6.2500 mg | INTRAMUSCULAR | Status: DC | PRN

## 2011-06-20 MED ORDER — DIBUCAINE 1 % RE OINT: 1.0000 "application " | TOPICAL_OINTMENT | RECTAL | Status: DC | PRN

## 2011-06-20 MED ORDER — WITCH HAZEL-GLYCERIN EX PADS: 1.0000 "application " | MEDICATED_PAD | CUTANEOUS | Status: DC | PRN

## 2011-06-20 MED ORDER — SENNOSIDES-DOCUSATE SODIUM 8.6-50 MG PO TABS
2.0000 | ORAL_TABLET | Freq: Every day | ORAL | Status: DC
  Administered 2011-06-21 – 2011-06-22 (×2): 2 via ORAL

## 2011-06-20 MED ORDER — OXYTOCIN 10 UNIT/ML IJ SOLN
INTRAMUSCULAR | Status: AC
  Filled 2011-06-20: qty 6

## 2011-06-20 MED ORDER — METHYLERGONOVINE MALEATE 0.2 MG/ML IJ SOLN: 0.2000 mg | INTRAMUSCULAR | Status: DC | PRN

## 2011-06-20 MED ORDER — ONDANSETRON HCL 4 MG/2ML IJ SOLN
INTRAMUSCULAR | Status: DC | PRN
  Administered 2011-06-20: 4 mg via INTRAVENOUS

## 2011-06-20 MED ORDER — SIMETHICONE 80 MG PO CHEW: 80.0000 mg | CHEWABLE_TABLET | ORAL | Status: DC | PRN

## 2011-06-20 MED ORDER — NALOXONE HCL 0.4 MG/ML IJ SOLN: 0.4000 mg | INTRAMUSCULAR | Status: DC | PRN

## 2011-06-20 MED ORDER — SIMETHICONE 80 MG PO CHEW
80.0000 mg | CHEWABLE_TABLET | Freq: Three times a day (TID) | ORAL | Status: DC
  Administered 2011-06-21 – 2011-06-23 (×9): 80 mg via ORAL

## 2011-06-20 MED ORDER — PHENYLEPHRINE HCL 10 MG/ML IJ SOLN
INTRAMUSCULAR | Status: DC | PRN
  Administered 2011-06-20 (×2): 80 ug via INTRAVENOUS

## 2011-06-20 MED ORDER — METHYLERGONOVINE MALEATE 0.2 MG PO TABS: 0.2000 mg | ORAL_TABLET | ORAL | Status: DC | PRN

## 2011-06-20 MED ORDER — SODIUM CHLORIDE 0.9 % IJ SOLN: 3.0000 mL | INTRAMUSCULAR | Status: DC | PRN

## 2011-06-20 MED ORDER — DIPHENHYDRAMINE HCL 50 MG/ML IJ SOLN: 12.5000 mg | INTRAMUSCULAR | Status: DC | PRN

## 2011-06-20 MED ORDER — ZOLPIDEM TARTRATE 5 MG PO TABS: 5.0000 mg | ORAL_TABLET | Freq: Every evening | ORAL | Status: DC | PRN

## 2011-06-20 MED ORDER — KETOROLAC TROMETHAMINE 30 MG/ML IJ SOLN
30.0000 mg | Freq: Four times a day (QID) | INTRAMUSCULAR | Status: AC | PRN
  Administered 2011-06-21: 30 mg via INTRAVENOUS
  Filled 2011-06-20: qty 1

## 2011-06-20 MED ORDER — LACTATED RINGERS IV SOLN
INTRAVENOUS | Status: DC
  Administered 2011-06-20 – 2011-06-21 (×2): via INTRAVENOUS

## 2011-06-20 MED ORDER — EPHEDRINE SULFATE 50 MG/ML IJ SOLN
INTRAMUSCULAR | Status: DC | PRN
  Administered 2011-06-20 (×3): 10 mg via INTRAVENOUS

## 2011-06-20 MED ORDER — TETANUS-DIPHTH-ACELL PERTUSSIS 5-2.5-18.5 LF-MCG/0.5 IM SUSP: 0.5000 mL | Freq: Once | INTRAMUSCULAR | Status: DC

## 2011-06-20 MED ORDER — IBUPROFEN 600 MG PO TABS
600.0000 mg | ORAL_TABLET | Freq: Four times a day (QID) | ORAL | Status: DC
  Administered 2011-06-21 – 2011-06-23 (×8): 600 mg via ORAL
  Filled 2011-06-20 (×9): qty 1

## 2011-06-20 MED ORDER — SCOPOLAMINE 1 MG/3DAYS TD PT72
MEDICATED_PATCH | TRANSDERMAL | Status: AC
  Filled 2011-06-20: qty 1

## 2011-06-20 MED ORDER — OXYTOCIN 20 UNITS IN LACTATED RINGERS INFUSION - SIMPLE
INTRAVENOUS | Status: DC | PRN
  Administered 2011-06-20 (×3): 20 [IU] via INTRAVENOUS

## 2011-06-20 MED ORDER — ONDANSETRON HCL 4 MG PO TABS: 4.0000 mg | ORAL_TABLET | ORAL | Status: DC | PRN

## 2011-06-20 MED ORDER — KETOROLAC TROMETHAMINE 60 MG/2ML IM SOLN
60.0000 mg | Freq: Once | INTRAMUSCULAR | Status: AC | PRN
  Filled 2011-06-20: qty 2

## 2011-06-20 MED ORDER — MENTHOL 3 MG MT LOZG: 1.0000 | LOZENGE | OROMUCOSAL | Status: DC | PRN

## 2011-06-20 MED ORDER — LACTATED RINGERS IV SOLN
INTRAVENOUS | Status: DC
  Administered 2011-06-20 (×4): via INTRAVENOUS

## 2011-06-20 MED ORDER — EPHEDRINE 5 MG/ML INJ
INTRAVENOUS | Status: AC
  Filled 2011-06-20: qty 10

## 2011-06-20 MED ORDER — MORPHINE SULFATE 0.5 MG/ML IJ SOLN
INTRAMUSCULAR | Status: AC
  Filled 2011-06-20: qty 10

## 2011-06-20 MED ORDER — DIPHENHYDRAMINE HCL 50 MG/ML IJ SOLN: 25.0000 mg | INTRAMUSCULAR | Status: DC | PRN

## 2011-06-20 MED ORDER — MORPHINE SULFATE (PF) 0.5 MG/ML IJ SOLN
INTRAMUSCULAR | Status: DC | PRN
  Administered 2011-06-20: .15 mg via INTRATHECAL

## 2011-06-20 MED ORDER — PHENYLEPHRINE 40 MCG/ML (10ML) SYRINGE FOR IV PUSH (FOR BLOOD PRESSURE SUPPORT)
PREFILLED_SYRINGE | INTRAVENOUS | Status: AC
  Filled 2011-06-20: qty 5

## 2011-06-20 MED ORDER — HYDROMORPHONE HCL PF 1 MG/ML IJ SOLN
0.2500 mg | INTRAMUSCULAR | Status: DC | PRN
  Administered 2011-06-20 (×2): 0.5 mg via INTRAVENOUS

## 2011-06-20 MED ORDER — FENTANYL CITRATE 0.05 MG/ML IJ SOLN
INTRAMUSCULAR | Status: DC | PRN
  Administered 2011-06-20: 25 ug via INTRATHECAL

## 2011-06-20 MED ORDER — BUPIVACAINE IN DEXTROSE 0.75-8.25 % IT SOLN
INTRATHECAL | Status: DC | PRN
  Administered 2011-06-20: 1.1 mL via INTRATHECAL

## 2011-06-20 MED ORDER — LANOLIN HYDROUS EX OINT: 1.0000 "application " | TOPICAL_OINTMENT | CUTANEOUS | Status: DC | PRN

## 2011-06-20 MED ORDER — DIPHENHYDRAMINE HCL 25 MG PO CAPS: 25.0000 mg | ORAL_CAPSULE | ORAL | Status: DC | PRN

## 2011-06-20 MED ORDER — NALBUPHINE HCL 10 MG/ML IJ SOLN: 5.0000 mg | INTRAMUSCULAR | Status: DC | PRN

## 2011-06-20 MED ORDER — METHYLERGONOVINE MALEATE 0.2 MG/ML IJ SOLN
INTRAMUSCULAR | Status: AC
  Filled 2011-06-20: qty 1

## 2011-06-20 MED ORDER — DIPHENHYDRAMINE HCL 25 MG PO CAPS: 25.0000 mg | ORAL_CAPSULE | Freq: Four times a day (QID) | ORAL | Status: DC | PRN

## 2011-06-20 MED ORDER — OXYTOCIN 20 UNITS IN LACTATED RINGERS INFUSION - SIMPLE: 125.0000 mL/h | INTRAVENOUS | Status: AC

## 2011-06-20 MED ORDER — PRENATAL MULTIVITAMIN CH
1.0000 | ORAL_TABLET | Freq: Every day | ORAL | Status: DC
  Administered 2011-06-21 – 2011-06-23 (×3): 1 via ORAL
  Filled 2011-06-20 (×5): qty 1

## 2011-06-20 MED ORDER — OXYCODONE-ACETAMINOPHEN 5-325 MG PO TABS
1.0000 | ORAL_TABLET | ORAL | Status: DC | PRN
  Administered 2011-06-21 – 2011-06-22 (×5): 1 via ORAL
  Administered 2011-06-22: 2 via ORAL
  Administered 2011-06-23: 1 via ORAL
  Filled 2011-06-20 (×4): qty 1
  Filled 2011-06-20: qty 2
  Filled 2011-06-20 (×2): qty 1

## 2011-06-20 MED ORDER — CITRIC ACID-SODIUM CITRATE 334-500 MG/5ML PO SOLN
30.0000 mL | Freq: Once | ORAL | Status: AC
  Administered 2011-06-20: 30 mL via ORAL
  Filled 2011-06-20: qty 15

## 2011-06-20 MED ORDER — FENTANYL CITRATE 0.05 MG/ML IJ SOLN: 25.0000 ug | INTRAMUSCULAR | Status: DC | PRN

## 2011-06-20 MED ORDER — DIPHENHYDRAMINE HCL 25 MG PO CAPS
25.0000 mg | ORAL_CAPSULE | ORAL | Status: DC | PRN
  Administered 2011-06-21: 25 mg via ORAL
  Filled 2011-06-20: qty 1

## 2011-06-20 MED ORDER — KETOROLAC TROMETHAMINE 60 MG/2ML IM SOLN: 60.0000 mg | Freq: Once | INTRAMUSCULAR | Status: DC | PRN

## 2011-06-20 MED ORDER — ONDANSETRON HCL 4 MG/2ML IJ SOLN: 4.0000 mg | INTRAMUSCULAR | Status: DC | PRN

## 2011-06-20 MED ORDER — PRENATAL RX 60-1 MG PO TABS: 1.0000 | ORAL_TABLET | Freq: Every day | ORAL | Status: DC

## 2011-06-20 MED ORDER — METHYLERGONOVINE MALEATE 0.2 MG/ML IJ SOLN
INTRAMUSCULAR | Status: DC | PRN
  Administered 2011-06-20: 0.2 mg via INTRAMUSCULAR

## 2011-06-20 MED ORDER — DIPHENHYDRAMINE HCL 50 MG/ML IJ SOLN
12.5000 mg | INTRAMUSCULAR | Status: DC | PRN
  Administered 2011-06-20: 12.5 mg via INTRAVENOUS
  Filled 2011-06-20: qty 1

## 2011-06-20 MED ORDER — CEFAZOLIN SODIUM 1-5 GM-% IV SOLN
1.0000 g | INTRAVENOUS | Status: AC
  Administered 2011-06-20: 1 g via INTRAVENOUS
  Filled 2011-06-20: qty 50

## 2011-06-20 MED ORDER — SCOPOLAMINE 1 MG/3DAYS TD PT72
1.0000 | MEDICATED_PATCH | Freq: Once | TRANSDERMAL | Status: DC
  Administered 2011-06-20: 1.5 mg via TRANSDERMAL

## 2011-06-20 MED ORDER — FAMOTIDINE IN NACL 20-0.9 MG/50ML-% IV SOLN
20.0000 mg | Freq: Once | INTRAVENOUS | Status: AC
  Administered 2011-06-20: 20 mg via INTRAVENOUS
  Filled 2011-06-20: qty 50

## 2011-06-20 SURGICAL SUPPLY — 31 items
ADH SKN CLS APL DERMABOND .7 (GAUZE/BANDAGES/DRESSINGS) ×2
CLOTH BEACON ORANGE TIMEOUT ST (SAFETY) ×2 IMPLANT
DERMABOND ADVANCED (GAUZE/BANDAGES/DRESSINGS) ×2
DERMABOND ADVANCED .7 DNX12 (GAUZE/BANDAGES/DRESSINGS) ×2 IMPLANT
DURAPREP 26ML APPLICATOR (WOUND CARE) ×4 IMPLANT
ELECT REM PT RETURN 9FT ADLT (ELECTROSURGICAL) ×2
ELECTRODE REM PT RTRN 9FT ADLT (ELECTROSURGICAL) ×1 IMPLANT
EXTRACTOR VACUUM BELL STYLE (SUCTIONS) IMPLANT
GLOVE BIOGEL PI IND STRL 8 (GLOVE) ×2 IMPLANT
GLOVE BIOGEL PI INDICATOR 8 (GLOVE) ×2
GLOVE ECLIPSE 8.0 STRL XLNG CF (GLOVE) ×2 IMPLANT
KIT ABG SYR 3ML LUER SLIP (SYRINGE) ×2 IMPLANT
NDL HYPO 25X5/8 SAFETYGLIDE (NEEDLE) ×1 IMPLANT
NEEDLE HYPO 25X5/8 SAFETYGLIDE (NEEDLE) ×2 IMPLANT
NS IRRIG 1000ML POUR BTL (IV SOLUTION) ×2 IMPLANT
PACK C SECTION WH (CUSTOM PROCEDURE TRAY) ×2 IMPLANT
RTRCTR C-SECT PINK 25CM LRG (MISCELLANEOUS) IMPLANT
SLEEVE SCD COMPRESS KNEE MED (MISCELLANEOUS) ×1 IMPLANT
STAPLER VISISTAT 35W (STAPLE) IMPLANT
SUT CHROMIC 0 CT 1 (SUTURE) ×2 IMPLANT
SUT MNCRL 0 VIOLET CTX 36 (SUTURE) ×2 IMPLANT
SUT MONOCRYL 0 CTX 36 (SUTURE) ×2
SUT PLAIN 2 0 (SUTURE)
SUT PLAIN 2 0 XLH (SUTURE) IMPLANT
SUT PLAIN ABS 2-0 CT1 27XMFL (SUTURE) IMPLANT
SUT VIC AB 0 CTX 36 (SUTURE) ×2
SUT VIC AB 0 CTX36XBRD ANBCTRL (SUTURE) ×1 IMPLANT
SUT VIC AB 4-0 KS 27 (SUTURE) ×1 IMPLANT
TOWEL OR 17X24 6PK STRL BLUE (TOWEL DISPOSABLE) ×4 IMPLANT
TRAY FOLEY CATH 14FR (SET/KITS/TRAYS/PACK) ×1 IMPLANT
WATER STERILE IRR 1000ML POUR (IV SOLUTION) ×1 IMPLANT

## 2011-06-20 NOTE — Transfer of Care (Signed)
Immediate Anesthesia Transfer of Care Note  Patient: Danielle [name]  Procedure(s) Performed: Procedure(s) (LRB): CESAREAN SECTION (N/A)  Patient Location: PACU  Anesthesia Type: Spinal  Level of Consciousness: awake and alert   Airway & Oxygen Therapy: Patient Spontanous Breathing  Post-op Assessment: Report given to PACU RN and Post -op Vital signs reviewed and stable  Post vital signs: Reviewed and stable  Complications: No apparent anesthesia complications

## 2011-06-20 NOTE — MAU Provider Note (Signed)
  History     CSN: [id number]  Arrival date and time: 06/20/11 1355   First Provider Initiated Contact with Patient 06/20/11 1508      Chief Complaint  Patient presents with  . Vaginal Bleeding   HPI This is a 31 y.o. female at [redacted]w[redacted]d who presents from pre-admission testing with report of small spot of blood on a pad. Spot is about dime size and pt denies pain or contractions. Denies recent intercourse. Eda Royal present to interpret.   There is a known history of placenta previa, which is why they are scheduling C/S on 06/22/11.  She reports she has never had a bleeding episode.  OB History    Grav Para Term Preterm Abortions TAB SAB Ect Mult Living   4 2 2  0 1 0 1 0 0 2      Past Medical History  Diagnosis Date  . Abnormal Pap smear     HSIL    Past Surgical History  Procedure Date  . No past surgeries     Family History  Problem Relation Age of Onset  . Cirrhosis Father     History  Substance Use Topics  . Smoking status: Never Smoker   . Smokeless tobacco: Never Used  . Alcohol Use: No    Allergies: No Known Allergies  Prescriptions prior to admission  Medication Sig Dispense Refill  . Prenatal Vit-Fe Fumarate-FA (PRENATAL MULTIVITAMIN) 60-1 MG tablet Take 1 tablet by mouth daily.  30 tablet  5    Review of Systems  Constitutional: Negative for fever.  Gastrointestinal: Negative for nausea, vomiting and abdominal pain.  Genitourinary:       Vaginal bleeding today   Physical Exam   Blood pressure 103/62, pulse 66, temperature 98.8 F (37.1 C), temperature source Oral, resp. rate 16, height 4' 9.5" (1.461 m), weight 127 lb (57.607 kg), last menstrual period 10/12/2010.  Physical Exam  Constitutional: She is oriented to person, place, and time. She appears well-developed and well-nourished. No distress.  HENT:  Head: Normocephalic.       Ecchymosis under left eye. Pt states was cleaning and something fell off onto her eye. Denies abuse    Cardiovascular: Normal rate, regular rhythm and normal heart sounds.   Respiratory: Effort normal and breath sounds normal. No respiratory distress. She has no wheezes. She has no rales.  GI: Soft. She exhibits no distension and no mass. There is no tenderness. There is no rebound and no guarding.  Genitourinary: Uterus normal. Vaginal discharge (Plum sized clot in vagina) found.       FHR reassuring Uterine irritability noted  Musculoskeletal: Normal range of motion.  Neurological: She is alert and oriented to person, place, and time.  Skin: Skin is warm and dry.  Psychiatric: She has a normal mood and affect.    MAU Course  Procedures   Assessment and Plan  A:  [facility] at [redacted]w[redacted]d       Placenta Previa      Vaginal Bleeding  P:  Dr Shawnie Pons consulted      Will schedule for C/S tonight       Type and cross blood       NPO       Consent and Preoperative preparations  [facility] 06/20/2011, 4:49 PM

## 2011-06-20 NOTE — Patient Instructions (Addendum)
   Your procedure is scheduled on: Friday April 19th  Enter through the [employer] [facility] at: 7:30am Pick up the phone at the desk and dial [phone] and inform us of your arrival.  Please call this number if you have any problems the morning of surgery: [phone]  Remember: Do not eat food after midnight: Thursday Do not drink clear liquids after:midnight Thursday Take these medicines the morning of surgery with a SIP OF WATER:none  Do not wear jewelry, make-up, or FINGER nail polish Do not wear lotions, powders, perfumes or deodorant. Do not shave 48 hours prior to surgery. Do not bring valuables to the hospital. Contacts, dentures or bridgework may not be worn into surgery.  Leave suitcase in the car. After Surgery it may be brought to your room. For patients being admitted to the hospital, checkout time is 11:00am the day of discharge.     Remember to use your hibiclens as instructed.Please shower with 1/2 bottle the evening before your surgery and the other 1/2 bottle the morning of surgery. Neck down avoiding private area.

## 2011-06-20 NOTE — Anesthesia Postprocedure Evaluation (Signed)
  Anesthesia Post-op Note  Patient: Danielle [name]  Procedure(s) Performed: Procedure(s) (LRB): CESAREAN SECTION (N/A)   Patient is awake, responsive, moving her legs, and has signs of resolution of her numbness. Pain and nausea are reasonably well controlled. Vital signs are stable and clinically acceptable. Oxygen saturation is clinically acceptable. There are no apparent anesthetic complications at this time. Patient is ready for discharge.

## 2011-06-20 NOTE — H&P (Signed)
   HPI This is a 31 y.o. female at [redacted]w[redacted]d who presents from pre-admission testing with report of small spot of blood on a pad. Spot is about dime size and pt denies pain or contractions. Denies recent intercourse. Eda Royal present to interpret.   There is a known history of placenta previa, which is why they are scheduling C/S on 06/22/11.  She reports she has never had a bleeding episode.  OB History    Grav Para Term Preterm Abortions TAB SAB Ect Mult Living   4 2 2  0 1 0 1 0 0 2      Past Medical History  Diagnosis Date  . Abnormal Pap smear     HSIL    Past Surgical History  Procedure Date  . No past surgeries     Family History  Problem Relation Age of Onset  . Cirrhosis Father     History  Substance Use Topics  . Smoking status: Never Smoker   . Smokeless tobacco: Never Used  . Alcohol Use: No    Allergies: No Known Allergies  Prescriptions prior to admission  Medication Sig Dispense Refill  . Prenatal Vit-Fe Fumarate-FA (PRENATAL MULTIVITAMIN) 60-1 MG tablet Take 1 tablet by mouth daily.  30 tablet  5    Review of Systems  Constitutional: Negative for fever.  Gastrointestinal: Negative for nausea, vomiting and abdominal pain.  Genitourinary:       Vaginal bleeding today   Physical Exam   Blood pressure 103/62, pulse 66, temperature 98.8 F (37.1 C), temperature source Oral, resp. rate 16, height 4' 9.5" (1.461 m), weight 127 lb (57.607 kg), last menstrual period 10/12/2010.  Physical Exam  Constitutional: She is oriented to person, place, and time. She appears well-developed and well-nourished. No distress.  HENT:  Head: Normocephalic.       Ecchymosis under left eye. Pt states was cleaning and something fell off onto her eye. Denies abuse  Cardiovascular: Normal rate, regular rhythm and normal heart sounds.   Respiratory: Effort normal and breath sounds normal. No respiratory distress. She has no wheezes. She has no rales.  GI: Soft. She exhibits no  distension and no mass. There is no tenderness. There is no rebound and no guarding.  Genitourinary: Uterus normal. Vaginal discharge (Plum sized clot in vagina) found.       FHR reassuring Uterine irritability noted  Musculoskeletal: Normal range of motion.  Neurological: She is alert and oriented to person, place, and time.  Skin: Skin is warm and dry.  Psychiatric: She has a normal mood and affect.    MAU Course  Procedures   Assessment and Plan  A:  [facility]UP at [redacted]w[redacted]d       Placenta Previa      Vaginal Bleeding  P:  Dr Shawnie Pons consulted      Will schedule for C/S tonight       Type and cross blood       NPO       Consent and Preoperative preparations  [facility] 06/20/2011, 4:49 PM

## 2011-06-20 NOTE — MAU Note (Signed)
House coverage and central nursery notified of plan for pt to go to OR for primary C/section.

## 2011-06-20 NOTE — Pre-Procedure Instructions (Addendum)
During pat interview pt admits to seeing blood on toilet tissue when wipes today. In light of previa-pt to MAU for evaluation. Labs not drawn at pat visit.

## 2011-06-20 NOTE — Anesthesia Preprocedure Evaluation (Addendum)
Anesthesia Evaluation  Patient identified by MRN, date of birth, ID band Patient awake    Reviewed: Allergy & Precautions, H&P , NPO status , Patient's Chart, lab work & pertinent test results, reviewed documented beta blocker date and time   History of Anesthesia Complications Negative for: history of anesthetic complications  Airway       Dental  (+)    Pulmonary neg pulmonary ROS,  breath sounds clear to auscultation        Cardiovascular negative cardio ROS      Neuro/Psych negative neurological ROS  negative psych ROS   GI/Hepatic negative GI ROS, Neg liver ROS,   Endo/Other  negative endocrine ROS  Renal/GU negative Renal ROS  negative genitourinary   Musculoskeletal   Abdominal   Peds  Hematology  (+) Blood dyscrasia (hgb 8.5 (was 9.4 one month ago)), anemia ,   Anesthesia Other Findings NOTE: Patient is 4'9" tall  NPO status - ate meat and tortillas at 11 am  Placenta Previa.........T&C done x2  Reproductive/Obstetrics (+) Pregnancy (placenta previa, spotting )                          Anesthesia Physical Anesthesia Plan  ASA: II and Emergent  Anesthesia Plan: Spinal   Post-op Pain Management:    Induction:   Airway Management Planned:   Additional Equipment:   Intra-op Plan:   Post-operative Plan:   Informed Consent: I have reviewed the patients History and Physical, chart, labs and discussed the procedure including the risks, benefits and alternatives for the proposed anesthesia with the patient or authorized representative who has indicated his/her understanding and acceptance.   Dental Advisory Given  Plan Discussed with: Surgeon and CRNA  Anesthesia Plan Comments:         Anesthesia Quick Evaluation

## 2011-06-20 NOTE — MAU Note (Addendum)
Patient was here for preop visit. For C/S on 4/19 due to previa. Patient is having some spotting. Was brought to MAU by Wilhemena Durie, RN Dr. Shawnie Pons at bedside to evaluated pt and vaginal bleeding via speculum exam.

## 2011-06-20 NOTE — Anesthesia Procedure Notes (Signed)
Spinal  Patient location during procedure: OR Preanesthetic Checklist Completed: patient identified, site marked, surgical consent, pre-op evaluation, timeout performed, IV checked, risks and benefits discussed and monitors and equipment checked Spinal Block Patient position: sitting Prep: DuraPrep Patient monitoring: heart rate, cardiac monitor, continuous pulse ox and blood pressure Approach: midline Location: L3-4 Injection technique: single-shot Needle Needle type: Sprotte  Needle gauge: 24 G Needle length: 9 cm Assessment Sensory level: T4 Additional Notes Spinal Dosage in OR  Bupivicaine ml       1.1 PFMS04   mcg        150 Fentanyl mcg            25    

## 2011-06-20 NOTE — Op Note (Signed)
Preoperative diagnosis:  1.  Intrauterine pregnancy at 36 2/[redacted] weeks gestation                                         2.  Placenta Previa, complete, anterior                                         3.  Bleeding, improved   Postoperative diagnosis:  Same as above   Procedure:  Primary cesarean section  Surgeon:  Lazaro Arms MD  Assistant:    Anesthesia: Spinal  Findings:  Over a low transverse incision was delivered a viable female with Apgars of 9 and 9 weighing pending lbs. pending oz. Uterus, tubes and ovaries were all normal.  There were no other significant findings.  The vascular bed for the placenta previa responded to double pitocin and methergine.  The uterus was firm  Description of operation:  Patient was taken to the operating room and placed in the sitting position where she underwent a spinal anesthetic. She was then placed in the supine position with tilt to the left side. When adequate anesthetic level was obtained she was prepped and draped in usual sterile fashion and a Foley catheter was placed. A Pfannenstiel skin incision was made and carried down sharply to the rectus fascia which was scored in the midline extended laterally. The fascia was taken off the muscles both superiorly and without difficulty. The muscles were divided.  The peritoneal cavity was entered.  Bladder blade was placed, no bladder flap was created.  A low transverse hysterotomy incision was made and delivered a viable female  infant at 82 with Apgars of 9 and 9 weighingpending lbs pending oz.  Cord pH was obtained and was 7.35. The uterus was exteriorized. It was closed in 2 layers, the first being a running interlocking layer and the second being an imbricating layer using 0 monocryl on a CTX needle. There was good resulting hemostasis. The uterus tubes and ovaries were all normal. Peritoneal cavity was irrigated vigorously. The muscles and peritoneum were reapproximated loosely. The fascia was closed  using 0 Vicryl in running fashion. Subcutaneous tissue was made hemostatic and irrigated. The skin was closed using 4-0 Vicryl on a Keith needle in a subcuticular fashion.  Dermabond was placed for additional wound integrity and to serve as a barrier. Blood loss for the procedure was 1000 cc. The patient received a gram of Ancef prophylactically. The patient was taken to the recovery room in good stable condition with all counts being correct x3.  EBL 1000 cc  Rossetta Kama H 06/20/2011 8:00 PM

## 2011-06-21 ENCOUNTER — Encounter (HOSPITAL_COMMUNITY): Payer: Self-pay | Admitting: Anesthesiology

## 2011-06-21 ENCOUNTER — Encounter (HOSPITAL_COMMUNITY): Payer: Self-pay | Admitting: Obstetrics & Gynecology

## 2011-06-21 LAB — CBC
HCT: 23.4 % — ABNORMAL LOW (ref 36.0–46.0)
Hemoglobin: 5.6 g/dL — CL (ref 12.0–15.0)
Hemoglobin: 7.4 g/dL — ABNORMAL LOW (ref 12.0–15.0)
MCH: 24.3 pg — ABNORMAL LOW (ref 26.0–34.0)
MCHC: 29.9 g/dL — ABNORMAL LOW (ref 30.0–36.0)
MCHC: 31.6 g/dL (ref 30.0–36.0)
MCV: 76.7 fL — ABNORMAL LOW (ref 78.0–100.0)
Platelets: 158 10*3/uL (ref 150–400)
RBC: 3.05 MIL/uL — ABNORMAL LOW (ref 3.87–5.11)
RDW: 14.9 % (ref 11.5–15.5)

## 2011-06-21 LAB — CULTURE, BETA STREP (GROUP B ONLY)

## 2011-06-21 MED ORDER — SODIUM CHLORIDE 0.9 % IV SOLN
Freq: Once | INTRAVENOUS | Status: AC
  Administered 2011-06-21: 11:00:00 via INTRAVENOUS

## 2011-06-21 NOTE — Progress Notes (Addendum)
Post Partum Day 1 Subjective: no complaints, up ad lib, voiding and tolerating PO, small lochia, plans to bottle feed  Objective: Blood pressure 96/51, pulse 64, temperature 98.2 F (36.8 C), temperature source Oral, resp. rate 18, height 4' 9.5" (1.461 m), weight 57.607 kg (127 lb), last menstrual period 10/12/2010, SpO2 100.00%, unknown if currently breastfeeding.  Physical Exam:  General: alert, cooperative and no distress Lochia:normal flow Chest: CTAB Heart: RRR no m/r/g Abdomen: +BS, soft, nontender, incision C/D/I w/o erythema Uterine Fundus: firm DVT Evaluation: No evidence of DVT seen on physical exam. Extremities: no edema   Basename 06/21/11 0543 06/20/11 1610  HGB 5.6* 8.5*  HCT 18.7* 28.3*    Assessment/Plan: POD # 1, S/P PLTCS for bleeding; anemia Transfuse 2 u PRBC   LOS: 1 day   [name],Danielle Stlouis 06/21/2011, 7:30 AM

## 2011-06-21 NOTE — Plan of Care (Signed)
Problem: Discharge Progression Outcomes Goal: MMR given as ordered Outcome: Not Applicable Date Met:  06/21/11 Rubella immune

## 2011-06-21 NOTE — Progress Notes (Signed)
UR chart review completed.  

## 2011-06-21 NOTE — Anesthesia Postprocedure Evaluation (Signed)
  Anesthesia Post-op Note  Patient: Danielle [name]  Procedure(s) Performed: Procedure(s) (LRB): CESAREAN SECTION (N/A)  Patient Location: Mother/Baby  Anesthesia Type: Spinal  Level of Consciousness: awake  Airway and Oxygen Therapy: Patient Spontanous Breathing  Post-op Pain: mild  Post-op Assessment: Patient's Cardiovascular Status Stable and Respiratory Function Stable  Post-op Vital Signs: stable  Complications: No apparent anesthesia complications

## 2011-06-21 NOTE — Progress Notes (Signed)
Call from lab with critical hgb of 5.6.  Called Dr Konrad Dolores at [phone] who is going to talk with the team.  No orders at this time.  Raynelle Chary

## 2011-06-21 NOTE — Progress Notes (Signed)
Pt is having some mild pain, hgb was 8.5 before delivery with 1000 ml EBL.  Pt is starting to have mild pain, called Dr Konrad Dolores to see if it was ok to give toradol.  He stated it was fine since it has been over 6 hours since delivery.   Raynelle Chary, RN

## 2011-06-21 NOTE — MAU Provider Note (Signed)
Chart reviewed and agree with management and plan.  

## 2011-06-22 ENCOUNTER — Encounter (HOSPITAL_COMMUNITY): Admission: RE | Payer: Self-pay | Source: Ambulatory Visit

## 2011-06-22 ENCOUNTER — Inpatient Hospital Stay (HOSPITAL_COMMUNITY)
Admission: RE | Admit: 2011-06-22 | Payer: Medicaid Other | Source: Ambulatory Visit | Admitting: Obstetrics & Gynecology

## 2011-06-22 LAB — TYPE AND SCREEN
ABO/RH(D): O POS
Unit division: 0

## 2011-06-22 SURGERY — Surgical Case
Anesthesia: Regional | Site: Abdomen

## 2011-06-22 NOTE — Progress Notes (Addendum)
Clinical Social Work Department  PSYCHOSOCIAL ASSESSMENT - MATERNAL/CHILD  06/22/2011  Patient: Danielle [name] Account Number: [id number] Admit Date: 06/20/2011  Childs Name:  Danielle [name]   Clinical Social Worker: Lenn Volker, LCSWA Date/Time: 06/22/2011 10:00 AM  Date Referred: 06/22/2011  Referral source   CN    Referred reason   Other - See comment   Other referral source:  I: FAMILY / HOME ENVIRONMENT  Child's legal guardian: PARENT  Guardian - Name  Guardian - Age  Guardian - Address   Danielle [name]  30  [address] [place], [place] [address]   Danielle [name]  31  (same as above)   Other household support members/support persons  Name  Relationship  DOB    DAUGHTER  02/2005    DAUGHTER  06/04/2009   Other support:  II PSYCHOSOCIAL DATA  Information Source: Patient Interview  Financial and Community Resources  Employment:  Financial resources: Self Pay  If Medicaid - County:  Other   WIC   School / Grade:  Maternity Care Coordinator / Child Services Coordination / Early Interventions: Cultural issues impacting care:  III STRENGTHS  Strengths   Adequate Resources   Home prepared for Child (including basic supplies)   Supportive family/friends   Strength comment:  IV RISK FACTORS AND CURRENT PROBLEMS  Current Problem: None  Risk Factor & Current Problem  Patient Issue  Family Issue  Risk Factor / Current Problem Comment    N  N  NPNC   V SOCIAL WORK ASSESSMENT  Sw referral received to assess reason for NPNC. Pt told Sw that [facility]. She could not start PNC sooner due to limited transportation. She denies any illegal substance use and verbalized understanding of hospital drug testing policy. UDS is negative, meconium results are pending. She reports having all the necessary supplies for the infant and good family support. Sw observed pt bonding well with the  infant during assessment. Sw will follow up with drug screen results and make a referral if needed.   VI SOCIAL WORK PLAN  Social Work Plan   No Further Intervention Required / No Barriers to Discharge   Type of pt/family education:  If child protective services report - county:  If child protective services report - date:  Information/referral to community resources comment:  Other social work plan:     

## 2011-06-22 NOTE — Progress Notes (Signed)
Post Partum Day 2 Subjective: no complaints, up ad lib, voiding, tolerating PO and (-) flatus at this point. She was given 2 units of blood yesterday, and her hgb was 7.4 at 1700 06/21/11. She is asymptomatic for anemia.   Objective: Blood pressure 93/52, pulse 57, temperature 98.2 F (36.8 C), temperature source Oral, resp. rate 18, height 4' 9.5" (1.461 m), weight 57.607 kg (127 lb), last menstrual period 10/12/2010, SpO2 100.00%, unknown if currently breastfeeding.  Physical Exam:  General: alert, cooperative, appears stated age and no distress Lochia: appropriate Uterine Fundus: firm Incision: healing well, no significant drainage, no dehiscence, no significant erythema DVT Evaluation: No evidence of DVT seen on physical exam. Negative Homan's sign. No cords or calf tenderness. No significant calf/ankle edema.   Basename 06/21/11 1700 06/21/11 0543  HGB 7.4* 5.6*  HCT 23.4* 18.7*    Assessment/Plan: Plan for discharge tomorrow Possible discharge today, re-evaluate this afternoon.    LOS: 2 days   Shara Blazing 06/22/2011, 8:12 AM    Patient seen and examined.  Agree with above note.    Candelaria Celeste JEHIEL 06/22/2011 11:09 AM

## 2011-06-23 MED ORDER — INTEGRA F 125-1 MG PO CAPS: 1.0000 | ORAL_CAPSULE | Freq: Every day | ORAL | Status: DC

## 2011-06-23 MED ORDER — OXYCODONE-ACETAMINOPHEN 5-325 MG PO TABS: 1.0000 | ORAL_TABLET | Freq: Four times a day (QID) | ORAL | Status: AC | PRN

## 2011-06-23 MED ORDER — DOCUSATE SODIUM 100 MG PO CAPS: 100.0000 mg | ORAL_CAPSULE | Freq: Two times a day (BID) | ORAL | Status: AC

## 2011-06-23 MED ORDER — IBUPROFEN 600 MG PO TABS: 600.0000 mg | ORAL_TABLET | Freq: Four times a day (QID) | ORAL | Status: AC | PRN
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
## 2011-06-23 NOTE — Discharge Summary (Addendum)
  Obstetric Discharge Summary Reason for Admission: placenta Previa, vaginal bleeding Prenatal Procedures: ultrasound Intrapartum Procedures: cesarean: low cervical, transverse Postpartum Procedures: transfusion 2 units Complications-Operative and Postpartum: none Hemoglobin  Date Value Range Status  06/21/2011 7.4* 12.0-15.0 (g/dL) Final     DELTA CHECK NOTED     POST TRANSFUSION SPECIMEN     HCT  Date Value Range Status  06/21/2011 23.4* 36.0-46.0 (%) Final    Physical Exam:  General: alert and cooperative Lochia: inappropriate Uterine Fundus: firm Incision: healing well DVT Evaluation: No evidence of DVT seen on physical exam.  Discharge Diagnoses: Term Pregnancy-delivered, Antepartum bleeding, Placenta previa and c-section  Discharge Information: Date: 06/23/2011 Activity: unrestricted Diet: routine Medications: Ibuprofen, Colace and Percocet Condition: stable Instructions: refer to practice specific booklet Discharge to: home Follow-up Information    Schedule an appointment as soon as possible for a visit in 6 weeks to follow up.         Newborn Data: Live born female  Birth Weight: 6 lb 4 oz (2835 g) APGAR: 8, 9  Home with mother.  ORTON,JONATHAN 06/23/2011, 8:30 AM

## 2011-06-23 NOTE — Discharge Summary (Signed)
I examined pt and agree with documentation above; pt reports feeling improvement in symptoms after transfusion and desires discharge home.  Added RX for Integra during the postpartum period. [facility]

## 2011-06-25 ENCOUNTER — Encounter: Payer: Self-pay | Admitting: Family Medicine

## 2011-07-05 ENCOUNTER — Ambulatory Visit: Payer: Self-pay | Admitting: Advanced Practice Midwife

## 2011-07-18 ENCOUNTER — Ambulatory Visit (INDEPENDENT_AMBULATORY_CARE_PROVIDER_SITE_OTHER): Payer: Self-pay | Admitting: Family

## 2011-07-18 ENCOUNTER — Encounter: Payer: Self-pay | Admitting: Family

## 2011-07-18 LAB — POCT URINALYSIS DIP (DEVICE)
Bilirubin Urine: NEGATIVE
Ketones, ur: NEGATIVE mg/dL
Leukocytes, UA: NEGATIVE
Protein, ur: NEGATIVE mg/dL
Specific Gravity, Urine: <=1.005 (ref 1.005–1.030)
pH: 5.5 (ref 5.0–8.0)

## 2011-07-18 LAB — POCT PREGNANCY, URINE: Preg Test, Ur: NEGATIVE

## 2011-07-18 MED ORDER — NORGESTIMATE-ETH ESTRADIOL 0.25-35 MG-MCG PO TABS: 1.0000 | ORAL_TABLET | Freq: Every day | ORAL | Status: DC

## 2011-07-18 NOTE — Progress Notes (Signed)
  Subjective:     Danielle [name] is a 31 y.o. female who presents for a postpartum visit. She is 4 weeks postpartum following a low cervical transverse Cesarean section. I have fully reviewed the prenatal and intrapartum course. The delivery was at 36 gestational weeks. Outcome: primary cesarean section, low transverse incision and for placenta previa. Anesthesia: spinal. Postpartum course has been unremarkable, except for irregular bleeding.  Bleeding will occur, gush 4 times in one day, every every other day for past week. Baby's course has been unremarkable. Baby is feeding by both breast and bottle - Gerber. Bleeding red and (see above). Bowel function is normal. Bladder function is normal. Patient is not sexually active. Contraception method is none at this time, desiring info.. Postpartum depression screening: negative.  History of HGSIL, colposcopy done at prenatal visit by Dr. Jolayne Panther on 05/28/11.  Lesion seen at 0900, no biopsy done.    The following portions of the patient's history were reviewed and updated as appropriate: allergies, current medications, past family history, past medical history, past social history, past surgical history and problem list.  Review of Systems Pertinent items are noted in HPI.   Objective:    BP 108/70  Pulse 73  Temp(Src) 97.1 F (36.2 C) (Oral)  Ht 5' (1.524 m)  Wt 111 lb 12.8 oz (50.712 kg)  BMI 21.83 kg/m2  LMP 10/12/2010  Breastfeeding? Yes  General:  alert, cooperative and appears stated age   Breasts:  negative  Lungs: clear to auscultation bilaterally  Heart:  regular rate and rhythm, S1, S2 normal, no murmur, click, rub or gallop  Abdomen: soft, non-tender; bowel sounds normal; no masses,  no organomegaly   Vulva:  normal  Vagina: vagina positive for scant bleeding  Cervix:  no cervical motion tenderness  Corpus: normal size, contour, position, consistency, mobility, non-tender  Adnexa:  normal adnexa and no mass, fullness,  tenderness  Rectal Exam: Normal rectovaginal exam        Assessment:   Normal postpartum exam. Pap smear not done at today's visit.  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 Contraception: OCP (estrogen/progesterone); [facility] app completed. 2. Reschedule for colposcopy 3. Follow up in: 2 weeks or as needed.

## 2011-07-18 NOTE — Progress Notes (Signed)
Pt c/o continued bleeding.  Had clots x 4.  Choose IUD for contraception.

## 2011-08-16 ENCOUNTER — Ambulatory Visit: Payer: Self-pay | Admitting: Advanced Practice Midwife

## 2011-08-29 ENCOUNTER — Other Ambulatory Visit (HOSPITAL_COMMUNITY)
Admission: RE | Admit: 2011-08-29 | Discharge: 2011-08-29 | Disposition: A | Discharge status: Home / Self Care | Payer: Self-pay | Source: Ambulatory Visit | Attending: Physician Assistant | Admitting: Physician Assistant

## 2011-08-29 ENCOUNTER — Encounter: Payer: Self-pay | Admitting: Physician Assistant

## 2011-08-29 ENCOUNTER — Ambulatory Visit (INDEPENDENT_AMBULATORY_CARE_PROVIDER_SITE_OTHER): Payer: Self-pay | Admitting: Physician Assistant

## 2011-08-29 VITALS — BP 101/66 | HR 76 | Temp 97.8°F | Ht <= 58 in | Wt 109.8 lb

## 2011-08-29 DIAGNOSIS — R87613 High grade squamous intraepithelial lesion on cytologic smear of cervix (HGSIL): Secondary | ICD-10-CM

## 2011-08-29 DIAGNOSIS — N870 Mild cervical dysplasia: Secondary | ICD-10-CM | POA: Insufficient documentation

## 2011-08-29 DIAGNOSIS — Z01812 Encounter for preprocedural laboratory examination: Secondary | ICD-10-CM

## 2011-08-29 LAB — POCT PREGNANCY, URINE: Preg Test, Ur: NEGATIVE

## 2011-08-29 NOTE — Progress Notes (Signed)
Pt presents secondary to HSIL pap found during pregnancy (05/2011). Colpo was unable to be performed secondary to placenta previa. Patient given informed consent, signed copy in the chart, time out was performed.  Placed in lithotomy position. Cervix viewed with speculum and colposcope after application of acetic acid.   Colposcopy adequate?  Yes Acetowhite lesions?Yes Punctation?No Mosaicism?  Yes Abnormal vasculature?  No Biopsies?11 o'clock and 7 o'clocl ECC?yes  COMMENTS: Patient was given post procedure instructions.  She will return in 2 weeks for results.

## 2011-08-29 NOTE — Patient Instructions (Signed)
Colposcopa, Cuidado posterior (Colposcopy, Care After) La colposcopa es un procedimiento en el que se utiliza una herramienta especial para magnificar la superficie del cuello del tero. Tambin es posible que se tome una muestra de tejido (biopsia). Esta muestra se observar para identificar la presencia de cncer cervical u otros problemas. Despus del procedimiento:  Podr sentir algunos clicos.   Recustese algunos minutos si se siente mareada.   Podr tener un sangrado que debera detenerse luego de algunos das.  CUIDADOS EN EL HOGAR  No tenga relaciones sexuales ni use tampones durante 2 o 3 das o segn le hayan indicado.   Slo tome medicamentos como lo indique su mdico.   Contine tomando las pastillas anticonceptivas de la forma habitual.  Averige los resultados de su anlisis Pregunte cundo estarn listos los resultados del examen. Asegrese de obtener los resultados. SOLICITE AYUDA DE INMEDIATO SI:  Tiene un sangrado abundante o elimina cogulos.   Su temperatura es de 102 F (38.9 C) o mayor.   Observa una secrecin vaginal anormal.   Tiene clicos que no se van con los medicamentos.   Siente mareos, vrtigo o pierde el conocimiento (se desmaya).  ASEGRESE DE QUE:   Comprende estas instrucciones.   Controlar su enfermedad.   Solicitar ayuda de inmediato si no mejora o si empeora.  Document Released: 03/24/2010 Document Revised: 02/08/2011 [employer] Patient Information 2012 [employer]. 

## 2011-09-21 ENCOUNTER — Ambulatory Visit: Payer: Self-pay | Admitting: Advanced Practice Midwife

## 2011-10-29 ENCOUNTER — Ambulatory Visit: Payer: Self-pay | Admitting: Advanced Practice Midwife

## 2011-11-10 ENCOUNTER — Encounter (HOSPITAL_COMMUNITY): Payer: Self-pay | Admitting: Adult Health

## 2011-11-10 ENCOUNTER — Emergency Department (HOSPITAL_COMMUNITY)
Admission: EM | Admit: 2011-11-10 | Discharge: 2011-11-11 | Disposition: A | Discharge status: Home / Self Care | Payer: Self-pay | Attending: Emergency Medicine | Admitting: Emergency Medicine

## 2011-11-10 DIAGNOSIS — T07XXXA Unspecified multiple injuries, initial encounter: Secondary | ICD-10-CM | POA: Diagnosis present

## 2011-11-10 DIAGNOSIS — S20229A Contusion of unspecified back wall of thorax, initial encounter: Secondary | ICD-10-CM | POA: Insufficient documentation

## 2011-11-10 DIAGNOSIS — S0003XA Contusion of scalp, initial encounter: Secondary | ICD-10-CM | POA: Insufficient documentation

## 2011-11-10 DIAGNOSIS — S1093XA Contusion of unspecified part of neck, initial encounter: Secondary | ICD-10-CM | POA: Insufficient documentation

## 2011-11-10 DIAGNOSIS — S0083XA Contusion of other part of head, initial encounter: Secondary | ICD-10-CM

## 2011-11-10 DIAGNOSIS — S7010XA Contusion of unspecified thigh, initial encounter: Secondary | ICD-10-CM | POA: Insufficient documentation

## 2011-11-10 DIAGNOSIS — S0510XA Contusion of eyeball and orbital tissues, unspecified eye, initial encounter: Secondary | ICD-10-CM | POA: Insufficient documentation

## 2011-11-10 MED ORDER — OXYCODONE-ACETAMINOPHEN 5-325 MG PO TABS
1.0000 | ORAL_TABLET | Freq: Once | ORAL | Status: AC
  Administered 2011-11-10: 1 via ORAL
  Filled 2011-11-10: qty 1
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
## 2011-11-11 ENCOUNTER — Emergency Department (HOSPITAL_COMMUNITY): Payer: Self-pay

## 2011-11-11 DIAGNOSIS — S0083XA Contusion of other part of head, initial encounter: Secondary | ICD-10-CM | POA: Diagnosis present

## 2011-11-11 DIAGNOSIS — T07XXXA Unspecified multiple injuries, initial encounter: Secondary | ICD-10-CM | POA: Diagnosis present

## 2011-11-11 MED ORDER — HYDROCODONE-ACETAMINOPHEN 5-500 MG PO TABS: 1.0000 | ORAL_TABLET | Freq: Four times a day (QID) | ORAL | Status: AC | PRN

## 2011-11-11 NOTE — ED Notes (Signed)
PT ambulated with baseline gait; VSS; A&Ox3; no signs of distress; respirations even and unlabored; skin warm and dry; no questions upon discharge.  

## 2011-11-11 NOTE — ED Notes (Signed)
Patient returned from CT
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
## 2013-03-23 ENCOUNTER — Ambulatory Visit (INDEPENDENT_AMBULATORY_CARE_PROVIDER_SITE_OTHER): Payer: Self-pay | Admitting: Obstetrics and Gynecology

## 2013-03-23 VITALS — BP 109/63 | HR 82 | Ht 60.0 in | Wt 106.5 lb

## 2013-03-23 DIAGNOSIS — N870 Mild cervical dysplasia: Secondary | ICD-10-CM

## 2013-03-23 NOTE — Progress Notes (Signed)
Patient not seen today. She is self pay so I referred her to a free screening on Feb 2. Patient agreeable to this. Also reviewed results of her colpo from 2013 with her. Advised her of the importance of having repeat pap smear.

## 2013-03-23 NOTE — Progress Notes (Signed)
Patient ID: Dorothea GlassmanMaria Elena Spielberg, female   DOB: 1980-11-01, 33 y.o.   MRN: [id number] Patient was not seen by provider today. Following counseling by RN, patient opted to have repeat pap smear at free cervical cancer screening sessions
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
## 2013-04-21 ENCOUNTER — Ambulatory Visit: Payer: Self-pay

## 2013-06-11 ENCOUNTER — Ambulatory Visit: Payer: Self-pay | Admitting: Family Medicine

## 2013-08-12 ENCOUNTER — Ambulatory Visit: Payer: Self-pay | Admitting: Internal Medicine

## 2014-01-04 ENCOUNTER — Encounter (HOSPITAL_COMMUNITY): Payer: Self-pay | Admitting: Adult Health

## 2022-07-04 ENCOUNTER — Other Ambulatory Visit (HOSPITAL_COMMUNITY)
Admission: RE | Admit: 2022-07-04 | Discharge: 2022-07-04 | Disposition: A | Discharge status: Home / Self Care | Payer: Self-pay | Source: Ambulatory Visit | Attending: Obstetrics & Gynecology | Admitting: Obstetrics & Gynecology

## 2022-07-04 ENCOUNTER — Other Ambulatory Visit: Payer: Self-pay

## 2022-07-04 ENCOUNTER — Ambulatory Visit (INDEPENDENT_AMBULATORY_CARE_PROVIDER_SITE_OTHER): Payer: Self-pay | Admitting: Obstetrics & Gynecology

## 2022-07-04 VITALS — BP 111/70 | HR 68 | Wt 132.5 lb

## 2022-07-04 DIAGNOSIS — N921 Excessive and frequent menstruation with irregular cycle: Secondary | ICD-10-CM

## 2022-07-04 DIAGNOSIS — Z975 Presence of (intrauterine) contraceptive device: Secondary | ICD-10-CM

## 2022-07-04 DIAGNOSIS — N898 Other specified noninflammatory disorders of vagina: Secondary | ICD-10-CM | POA: Insufficient documentation

## 2022-07-04 MED ORDER — DOXYCYCLINE HYCLATE 100 MG PO CAPS: 100.0000 mg | ORAL_CAPSULE | Freq: Two times a day (BID) | ORAL | 0 refills | Status: AC

## 2022-07-04 NOTE — Progress Notes (Signed)
Patient ID: Danielle [name], female   DOB: 18-Feb-1981, 42 y.o.   MRN: [id number]  Chief Complaint  Patient presents with   Gynecologic Exam    HPI Danielle [name] is a 42 y.o. female.  [id number] No LMP recorded. LMP 4/29-current. Since December she has had intermenstrual spotting with regular menses. Paragard in place for 7 years with no problems. No pain. She was seen at [facility] and sx did not improve after treatment for infection HPI  Past Medical History:  Diagnosis Date   Abnormal Pap smear    HSIL    Past Surgical History:  Procedure Laterality Date   CESAREAN SECTION  06/20/2011   Procedure: CESAREAN SECTION;  Surgeon: Lazaro Arms, MD;  Location: [facility];  Service: Gynecology;  Laterality: N/A;   NO PAST SURGERIES      Family History  Problem Relation Age of Onset   Cirrhosis Father     Social History Social History   Tobacco Use   Smoking status: Never   Smokeless tobacco: Never  Substance Use Topics   Alcohol use: No   Drug use: No    No Known Allergies  Current Outpatient Medications  Medication Sig Dispense Refill   doxycycline (VIBRAMYCIN) 100 MG capsule Take 1 capsule (100 mg total) by mouth 2 (two) times daily. 20 capsule 0   No current facility-administered medications for this visit.    Review of Systems Review of Systems  Constitutional: Negative.   Genitourinary:  Positive for menstrual problem and vaginal bleeding. Negative for pelvic pain and vaginal discharge.    Blood pressure 111/70, pulse 68, weight 132 lb 8 oz (60.1 kg).  Physical Exam Physical Exam Vitals and nursing note reviewed. Exam conducted with a chaperone present.  Constitutional:      Appearance: Normal appearance.  HENT:     Head: Normocephalic and atraumatic.  Cardiovascular:     Rate and Rhythm: Normal rate.  Pulmonary:     Effort: Pulmonary effort is normal.  Abdominal:     General: Abdomen is flat.     Palpations: Abdomen is soft.   Genitourinary:    General: Normal vulva.     Exam position: Lithotomy position.     Vagina: Bleeding present.     Cervix: Normal.     Uterus: Normal.      Adnexa: Right adnexa normal and left adnexa normal.     Comments: String 4 cm Skin:    General: Skin is warm and dry.  Neurological:     Mental Status: She is alert.  Psychiatric:        Mood and Affect: Mood normal.        Behavior: Behavior normal.     Data Reviewed   Assessment Breakthrough bleeding with IUD - Plan: doxycycline (VIBRAMYCIN) 100 MG capsule, US PELVIC COMPLETE WITH TRANSVAGINAL  Vaginal discharge - Plan: Cervicovaginal ancillary only   Plan Need to check IUD position and will treat empirically with Doxycycline  RTC 6 weeks    Scheryl Darter 07/04/2022, 4:57 PM

## 2022-07-05 LAB — CERVICOVAGINAL ANCILLARY ONLY
Bacterial Vaginitis (gardnerella): NEGATIVE
Candida Glabrata: NEGATIVE
Candida Vaginitis: NEGATIVE
Chlamydia: NEGATIVE
Comment: NEGATIVE
Comment: NEGATIVE
Comment: NEGATIVE
Comment: NEGATIVE
Comment: NEGATIVE
Comment: NORMAL
Neisseria Gonorrhea: NEGATIVE
Trichomonas: NEGATIVE

## 2022-07-06 ENCOUNTER — Ambulatory Visit (HOSPITAL_COMMUNITY)
Admission: RE | Admit: 2022-07-06 | Discharge: 2022-07-06 | Disposition: A | Discharge status: Home / Self Care | Payer: Self-pay | Source: Ambulatory Visit | Attending: Obstetrics & Gynecology | Admitting: Obstetrics & Gynecology

## 2022-07-06 DIAGNOSIS — N921 Excessive and frequent menstruation with irregular cycle: Secondary | ICD-10-CM | POA: Insufficient documentation

## 2022-07-06 DIAGNOSIS — Z975 Presence of (intrauterine) contraceptive device: Secondary | ICD-10-CM | POA: Insufficient documentation

## 2022-07-23 ENCOUNTER — Other Ambulatory Visit: Payer: Self-pay

## 2022-07-23 ENCOUNTER — Ambulatory Visit (INDEPENDENT_AMBULATORY_CARE_PROVIDER_SITE_OTHER): Payer: Self-pay | Admitting: Obstetrics & Gynecology

## 2022-07-23 ENCOUNTER — Encounter: Payer: Self-pay | Admitting: Obstetrics & Gynecology

## 2022-07-23 VITALS — BP 103/66 | HR 69 | Ht 60.0 in | Wt 131.9 lb

## 2022-07-23 DIAGNOSIS — N923 Ovulation bleeding: Secondary | ICD-10-CM

## 2022-07-23 NOTE — Progress Notes (Signed)
Patient ID: Danielle [name], female   DOB: 11/12/1980, 42 y.o.   MRN: [id number]  Chief Complaint  Patient presents with   Follow-up    HPI Danielle [name] Danielle [name] is a 42 y.o. female.  [id number] LMP5/224, which lasted 5 days not heavy. 1 week ago very light spotting noted after voiding. No pain. US showed normal IUD position no lesions  HPI  Past Medical History:  Diagnosis Date   Abnormal Pap smear    HSIL  Colpo done at [facility] in [place] no result available to review  Past Surgical History:  Procedure Laterality Date   CESAREAN SECTION  06/20/2011   Procedure: CESAREAN SECTION;  Surgeon: Lazaro Arms, MD;  Location: [facility];  Service: Gynecology;  Laterality: N/A;   NO PAST SURGERIES      Family History  Problem Relation Age of Onset   Cirrhosis Father     Social History Social History   Tobacco Use   Smoking status: Never   Smokeless tobacco: Never  Substance Use Topics   Alcohol use: No   Drug use: No    No Known Allergies  Current Outpatient Medications  Medication Sig Dispense Refill   doxycycline (VIBRAMYCIN) 100 MG capsule Take 1 capsule (100 mg total) by mouth 2 (two) times daily. (Patient not taking: Reported on 07/23/2022) 20 capsule 0   No current facility-administered medications for this visit.    Review of Systems Review of Systems  Constitutional: Negative.   Respiratory: Negative.    Cardiovascular: Negative.   Gastrointestinal: Negative.   Genitourinary:  Positive for vaginal bleeding. Negative for pelvic pain.    Blood pressure 103/66, pulse 69, height 5' (1.524 m), weight 131 lb 14.4 oz (59.8 kg).  Physical Exam Physical Exam Vitals and nursing note reviewed.  Constitutional:      Appearance: Normal appearance.  HENT:     Head: Normocephalic and atraumatic.  Cardiovascular:     Rate and Rhythm: Normal rate.  Pulmonary:     Effort: Pulmonary effort is normal.  Neurological:     Mental Status: She is alert.  Psychiatric:         Mood and Affect: Mood normal.        Behavior: Behavior normal.     Data Reviewed arrative & Impression  CLINICAL DATA:  Spotting with IUD, uncertain LMP   EXAM: TRANSABDOMINAL AND TRANSVAGINAL ULTRASOUND OF PELVIS   TECHNIQUE: Both transabdominal and transvaginal ultrasound examinations of the pelvis were performed. Transabdominal technique was performed for global imaging of the pelvis including uterus, ovaries, adnexal regions, and pelvic cul-de-sac. It was necessary to proceed with endovaginal exam following the transabdominal exam to visualize the endometrium and RIGHT ovary.   COMPARISON:  None available   FINDINGS: Uterus   Measurements: 9.2 x 4.0 x 6.8 cm = volume: 129 mL. Anteverted. Normal morphology without mass. Nabothian cysts at cervix.   Endometrium   Thickness: 5 mm. IUD in expected position at upper uterine segment endometrial canal. No endometrial fluid or mass   Right ovary   Measurements: 2.5 x 1.7 x 1.9 cm = volume: 4.1 mL. Normal morphology without mass   Left ovary   Measurements: 3.5 x 1.7 x 1.8 cm = volume: 5.8 mL. Normal morphology without mass   Other findings   No free pelvic fluid or adnexal masses.   IMPRESSION: IUD in expected position at upper uterine segment endometrial canal.   Otherwise normal exam.     Electronically Signed  By: Ulyses Southward M.D.   On: 07/06/2022 12:32    Assessment Intermenstrual spotting with Paragard, some improvement reported  She is willing to have expectant management for a few more weeks and discuss whether to change her [facility] when she returns  Plan RTC 6 week and consider changing BCM   25 minutes face to face, interpreter live and video assisted  Scheryl Darter 07/23/2022, 2:55 PM

## 2022-09-03 ENCOUNTER — Ambulatory Visit: Payer: Self-pay | Admitting: Obstetrics & Gynecology

## 2024-05-01 ENCOUNTER — Ambulatory Visit: Payer: Self-pay | Admitting: Family Medicine
